# Patient Record
Sex: Male | Born: 1955 | Race: White | Hispanic: No | Marital: Single | Smoking: Current every day smoker
Health system: Southern US, Community
[De-identification: ages and names within clinical notes are randomized; demographics above are authoritative.]

## PROBLEM LIST (undated history)

## (undated) DIAGNOSIS — I10 Essential (primary) hypertension: Secondary | ICD-10-CM

## (undated) DIAGNOSIS — E785 Hyperlipidemia, unspecified: Secondary | ICD-10-CM

## (undated) DIAGNOSIS — J438 Other emphysema: Secondary | ICD-10-CM

## (undated) HISTORY — DX: Hyperlipidemia, unspecified: E78.5

## (undated) HISTORY — DX: Other emphysema: J43.8

## (undated) HISTORY — DX: Essential (primary) hypertension: I10

---

## 2013-04-29 ENCOUNTER — Ambulatory Visit: Payer: Self-pay | Admitting: Gastroenterology

## 2013-05-02 LAB — PATHOLOGY REPORT

## 2013-08-01 ENCOUNTER — Ambulatory Visit: Payer: Self-pay | Admitting: Family Medicine

## 2015-05-23 ENCOUNTER — Telehealth: Payer: Self-pay | Admitting: Family Medicine

## 2015-05-24 NOTE — Telephone Encounter (Signed)
-----   Message from Ellyn HackSyed Asad A Shah, MD sent at 05/23/2015  6:29 PM EDT ----- Regarding: Lab results. HDL is below normal. LDL, TC, and TG are within normal limits.

## 2015-05-24 NOTE — Telephone Encounter (Signed)
Patient notified. Amann CMA

## 2015-08-23 ENCOUNTER — Ambulatory Visit (INDEPENDENT_AMBULATORY_CARE_PROVIDER_SITE_OTHER): Payer: Managed Care, Other (non HMO) | Admitting: Family Medicine

## 2015-08-23 ENCOUNTER — Encounter: Payer: Self-pay | Admitting: Family Medicine

## 2015-08-23 VITALS — BP 121/70 | HR 85 | Temp 97.8°F | Resp 18 | Ht 68.0 in | Wt 198.5 lb

## 2015-08-23 DIAGNOSIS — E785 Hyperlipidemia, unspecified: Secondary | ICD-10-CM | POA: Diagnosis not present

## 2015-08-23 DIAGNOSIS — I10 Essential (primary) hypertension: Secondary | ICD-10-CM | POA: Insufficient documentation

## 2015-08-23 NOTE — Progress Notes (Signed)
Name: Travis Watson   MRN: [id number]    DOB: 08-01-1956   Date:08/23/2015       Progress Note  Subjective  Chief Complaint  Chief Complaint  Patient presents with  . Follow-up    3 mo.  . Hypertension  . Hyperlipidemia    Hypertension This is a chronic problem. The problem is controlled. Pertinent negatives include no chest pain, headaches, palpitations or shortness of breath. Past treatments include angiotensin blockers. There is no history of kidney disease, CAD/MI or CVA.  Hyperlipidemia This is a chronic problem. The problem is controlled. Recent lipid tests were reviewed and are normal. Pertinent negatives include no chest pain, leg pain, myalgias or shortness of breath. Current antihyperlipidemic treatment includes statins. Risk factors for coronary artery disease include dyslipidemia and male sex.   Past Medical History  Diagnosis Date  . Hyperlipidemia   . Hypertension     History reviewed. No pertinent past surgical history.  Family History  Problem Relation Age of Onset  . Diabetes Mother   . Emphysema Mother   . Hyperlipidemia Father   . Cancer Father     prostate  . Heart disease Paternal Grandfather     Social History   Social History  . Marital Status: Single    Spouse Name: N/A  . Number of Children: N/A  . Years of Education: N/A   Occupational History  . Not on file.   Social History Main Topics  . Smoking status: Current Every Day Smoker -- 0.50 packs/day    Types: Cigarettes  . Smokeless tobacco: Former Neurosurgeon    Types: Snuff     Comment: quit smokeless tabacco about 40 years ago  . Alcohol Use: 0.0 oz/week    0 Standard drinks or equivalent per week     Comment: occasional  . Drug Use: No  . Sexual Activity: Not on file   Other Topics Concern  . Not on file   Social History Narrative  . No narrative on file     Current outpatient prescriptions:  .  atorvastatin (LIPITOR) 40 MG tablet, Take 1 tablet by mouth at bedtime.,  Disp: , Rfl: 1 .  losartan (COZAAR) 50 MG tablet, Take 50 mg by mouth daily., Disp: , Rfl: 5  No Known Allergies   Review of Systems  Respiratory: Negative for shortness of breath.   Cardiovascular: Negative for chest pain and palpitations.  Musculoskeletal: Negative for myalgias.  Neurological: Negative for headaches.   Objective  Filed Vitals:   08/23/15 0817  BP: 121/70  Pulse: 85  Temp: 97.8 F (36.6 C)  TempSrc: Oral  Resp: 18  Height:  (1.727 m)  Weight: 198 lb 8 oz (90.039 kg)  SpO2: 96%    Physical Exam  Constitutional: He is oriented to person, place, and time and well-developed, well-nourished, and in no distress.  HENT:  Head: Normocephalic and atraumatic.  Cardiovascular: Normal rate and regular rhythm.   Pulmonary/Chest: Effort normal and breath sounds normal.  Neurological: He is alert and oriented to person, place, and time.  Psychiatric: Affect and judgment normal.  Nursing note and vitals reviewed.   Assessment & Plan  1. Essential hypertension Blood Pressure at goal on present therapy. Follow-up in 4 months.  2. Dyslipidemia  - Comprehensive Metabolic Panel (CMET) - Lipid Profile   [facility]ravis Watson [facility] 08/23/2015 8:38 AM

## 2015-08-24 LAB — COMPREHENSIVE METABOLIC PANEL
A/G RATIO: 2 (ref 1.1–2.5)
ALK PHOS: 60 IU/L (ref 39–117)
ALT: 26 IU/L (ref 0–44)
AST: 16 IU/L (ref 0–40)
Albumin: 4.6 g/dL (ref 3.5–5.5)
BUN/Creatinine Ratio: 13 (ref 9–20)
BUN: 12 mg/dL (ref 6–24)
Bilirubin Total: 0.5 mg/dL (ref 0.0–1.2)
CO2: 25 mmol/L (ref 18–29)
Calcium: 9.2 mg/dL (ref 8.7–10.2)
Chloride: 103 mmol/L (ref 97–108)
Creatinine, Ser: 0.95 mg/dL (ref 0.76–1.27)
GFR calc non Af Amer: 88 mL/min/{1.73_m2} (ref >59)
GFR, EST AFRICAN AMERICAN: 102 mL/min/{1.73_m2} (ref >59)
GLUCOSE: 109 mg/dL — AB (ref 65–99)
Globulin, Total: 2.3 g/dL (ref 1.5–4.5)
POTASSIUM: 5.1 mmol/L (ref 3.5–5.2)
Sodium: 141 mmol/L (ref 134–144)
TOTAL PROTEIN: 6.9 g/dL (ref 6.0–8.5)

## 2015-08-24 LAB — LIPID PANEL
CHOLESTEROL TOTAL: 118 mg/dL (ref 100–199)
Chol/HDL Ratio: 4.5 ratio units (ref 0.0–5.0)
HDL: 26 mg/dL — ABNORMAL LOW (ref >39)
LDL Calculated: 76 mg/dL (ref 0–99)
Triglycerides: 82 mg/dL (ref 0–149)
VLDL Cholesterol Cal: 16 mg/dL (ref 5–40)

## 2015-09-10 ENCOUNTER — Telehealth: Payer: Self-pay | Admitting: Family Medicine

## 2015-09-10 NOTE — Telephone Encounter (Signed)
Pt has been out of his atorvastatin for 10 days and he is requesting refill to be done. [facility].

## 2015-09-11 MED ORDER — ATORVASTATIN CALCIUM 40 MG PO TABS: 40.0000 mg | ORAL_TABLET | Freq: Every day | ORAL | Status: DC

## 2015-09-11 NOTE — Telephone Encounter (Signed)
Medication has been sent to pharmacy.  °

## 2015-09-11 NOTE — Telephone Encounter (Signed)
LMOM to inform pt of RX °

## 2015-11-16 ENCOUNTER — Other Ambulatory Visit: Payer: Self-pay | Admitting: Family Medicine

## 2015-12-25 ENCOUNTER — Encounter: Payer: Self-pay | Admitting: Family Medicine

## 2015-12-25 ENCOUNTER — Ambulatory Visit (INDEPENDENT_AMBULATORY_CARE_PROVIDER_SITE_OTHER): Payer: Managed Care, Other (non HMO) | Admitting: Family Medicine

## 2015-12-25 VITALS — BP 120/74 | HR 98 | Temp 99.2°F | Resp 16 | Ht 68.0 in | Wt 194.1 lb

## 2015-12-25 DIAGNOSIS — E785 Hyperlipidemia, unspecified: Secondary | ICD-10-CM

## 2015-12-25 DIAGNOSIS — I10 Essential (primary) hypertension: Secondary | ICD-10-CM

## 2015-12-25 DIAGNOSIS — R739 Hyperglycemia, unspecified: Secondary | ICD-10-CM | POA: Insufficient documentation

## 2015-12-25 DIAGNOSIS — Z716 Tobacco abuse counseling: Secondary | ICD-10-CM

## 2015-12-25 LAB — POCT GLYCOSYLATED HEMOGLOBIN (HGB A1C): HEMOGLOBIN A1C: 6

## 2015-12-25 MED ORDER — LOSARTAN POTASSIUM 50 MG PO TABS
50.0000 mg | ORAL_TABLET | Freq: Every day | ORAL | Status: DC
Start: 2015-12-25 — End: 2016-06-12

## 2015-12-25 MED ORDER — ATORVASTATIN CALCIUM 40 MG PO TABS: ORAL_TABLET | ORAL | Status: DC

## 2015-12-25 NOTE — Progress Notes (Signed)
Name: Travis SextonWilliam H Travis Watson Travis Watson   MRN: [id number]    DOB: Jan 23, 1956   Date:12/25/2015       Progress Note  Subjective  Chief Complaint  Chief Complaint  Patient presents with  . Follow-up    4 mo  . Hypertension  . Hyperlipidemia  . Medication Refill    Losartan 50 mg / atorvastatin 40mg     Hypertension This is a chronic problem. The problem is controlled. Pertinent negatives include no blurred vision, chest pain, headaches, orthopnea, palpitations or shortness of breath. Past treatments include angiotensin blockers. There is no history of kidney disease, CAD/MI or CVA.  Hyperlipidemia This is a chronic problem. The problem is controlled. Recent lipid tests were reviewed and are normal. Pertinent negatives include no chest pain, leg pain, myalgias or shortness of breath. Current antihyperlipidemic treatment includes statins. Risk factors for coronary artery disease include dyslipidemia and male sex.  Nicotine Dependence Presents for initial visit. Symptoms include cravings. Symptoms are negative for fatigue, headache and insomnia. Preferred tobacco types include cigarettes. Preferred cigarette types include filtered. Preferred brands include Marlboro. His urge triggers include company of smokers. His first smoke is from 6 to 8 AM. He smokes < 1/2 a pack of cigarettes per day. Past treatments include nothing. Compliance with prior treatments has been good. Travis Travis Watson has tried to quit 4 times.    Past Medical History  Diagnosis Date  . Hyperlipidemia   . Hypertension     History reviewed. No pertinent past surgical history.  Family History  Problem Relation Age of Onset  . Diabetes Mother   . Emphysema Mother   . Hyperlipidemia Father   . Cancer Father     prostate  . Heart disease Paternal Grandfather     Social History   Social History  . Marital Status: Single    Spouse Name: N/A  . Number of Children: N/A  . Years of Education: N/A   Occupational History  . Not on  file.   Social History Main Topics  . Smoking status: Current Every Day Smoker -- 0.50 packs/day    Types: Cigarettes  . Smokeless tobacco: Former NeurosurgeonUser    Types: Snuff     Comment: quit smokeless tabacco about 40 years ago  . Alcohol Use: 0.0 oz/week    0 Standard drinks or equivalent per week     Comment: occasional  . Drug Use: No  . Sexual Activity: Not on file   Other Topics Concern  . Not on file   Social History Narrative     Current outpatient prescriptions:  .  atorvastatin (LIPITOR) 40 MG tablet, TAKE 1 TABLET (40 MG TOTAL) BY MOUTH AT BEDTIME., Disp: 30 tablet, Rfl: 1 .  clobetasol cream (TEMOVATE) 0.05 %, APPLY TO AFFECTED AREA TWICE A DAY AS NEEDED -NOT TO FACE,GROIN OR UNDERARMS, Disp: , Rfl: 2 .  losartan (COZAAR) 50 MG tablet, Take 50 mg by mouth daily., Disp: , Rfl: 5  No Known Allergies   Review of Systems  Constitutional: Negative for fatigue.  Eyes: Negative for blurred vision.  Respiratory: Negative for shortness of breath.   Cardiovascular: Negative for chest pain, palpitations and orthopnea.  Musculoskeletal: Negative for myalgias.  Neurological: Negative for headaches.  Psychiatric/Behavioral: The patient does not have insomnia.     Objective  Filed Vitals:   12/25/15 0813  BP: 120/74  Pulse: 98  Temp: 99.2 F (37.3 C)  TempSrc: Oral  Resp: 16  Height: 5\' 8"  (1.727 m)  Weight: 194  lb 1.6 oz (88.043 kg)  SpO2: 97%    Physical Exam  Constitutional: He is oriented to person, place, and time and well-developed, well-nourished, and in no distress.  HENT:  Head: Normocephalic and atraumatic.  Eyes: Conjunctivae are normal. Pupils are equal, round, and reactive to light.  Cardiovascular: Normal rate and regular rhythm.   Pulmonary/Chest: Effort normal and breath sounds normal.  Abdominal: Soft. Bowel sounds are normal.  Musculoskeletal: Normal range of motion. He exhibits no edema.  Neurological: He is alert and oriented to person, place,  and time.  Psychiatric: Affect and judgment normal.  Nursing note and vitals reviewed.    Assessment & Plan  1. Essential hypertension BP at goal. - losartan (COZAAR) 50 MG tablet; Take 1 tablet (50 mg total) by mouth daily.  Dispense: 30 tablet; Refill: 5  2. Hyperlipidemia HDL below normal. Otherwise at goal. - Lipid Profile - atorvastatin (LIPITOR) 40 MG tablet; TAKE 1 TABLET (40 MG TOTAL) BY MOUTH AT BEDTIME.  Dispense: 30 tablet; Refill: 5  3. Hyperglycemia  - POCT HgB A1C  4. Tobacco abuse counseling Pt. Is not ready to quit at this time.Kathi Ludwig Asad A. [facility] 12/25/2015 8:23 AM

## 2015-12-26 LAB — LIPID PANEL
Chol/HDL Ratio: 5.1 ratio units — ABNORMAL HIGH (ref 0.0–5.0)
Cholesterol, Total: 127 mg/dL (ref 100–199)
HDL: 25 mg/dL — ABNORMAL LOW (ref >39)
LDL Calculated: 78 mg/dL (ref 0–99)
TRIGLYCERIDES: 118 mg/dL (ref 0–149)
VLDL Cholesterol Cal: 24 mg/dL (ref 5–40)

## 2016-06-12 ENCOUNTER — Other Ambulatory Visit: Payer: Self-pay | Admitting: Family Medicine

## 2016-06-16 ENCOUNTER — Ambulatory Visit: Payer: Managed Care, Other (non HMO) | Admitting: Family Medicine

## 2016-06-18 ENCOUNTER — Ambulatory Visit: Payer: Managed Care, Other (non HMO) | Admitting: Family Medicine

## 2016-06-25 ENCOUNTER — Encounter: Payer: Self-pay | Admitting: Family Medicine

## 2016-06-25 ENCOUNTER — Ambulatory Visit (INDEPENDENT_AMBULATORY_CARE_PROVIDER_SITE_OTHER): Payer: Managed Care, Other (non HMO) | Admitting: Family Medicine

## 2016-06-25 VITALS — BP 118/71 | HR 96 | Temp 98.6°F | Resp 15 | Ht 68.0 in | Wt 189.3 lb

## 2016-06-25 DIAGNOSIS — I10 Essential (primary) hypertension: Secondary | ICD-10-CM | POA: Diagnosis not present

## 2016-06-25 DIAGNOSIS — E786 Lipoprotein deficiency: Secondary | ICD-10-CM | POA: Diagnosis not present

## 2016-06-25 DIAGNOSIS — E785 Hyperlipidemia, unspecified: Secondary | ICD-10-CM

## 2016-06-25 MED ORDER — ATORVASTATIN CALCIUM 40 MG PO TABS: ORAL_TABLET | ORAL | Status: DC

## 2016-06-25 NOTE — Progress Notes (Signed)
Name: Travis SextonWilliam H Kelnhofer III   MRN: [id number]    DOB: Oct 24, 1956   Date:06/25/2016       Progress Note  Subjective  Chief Complaint  Chief Complaint  Patient presents with  . Follow-up    Cholesterol check  . Medication Refill    atorvastatin 40 mg     Hypertension This is a chronic problem. The problem is unchanged. The problem is controlled. Pertinent negatives include no blurred vision, chest pain, headaches or palpitations. Past treatments include angiotensin blockers.  Hyperlipidemia This is a chronic problem. The problem is uncontrolled (Below normal HDL). Recent lipid tests were reviewed and are low (Below normal HDL.). Pertinent negatives include no chest pain. Current antihyperlipidemic treatment includes statins.     Past Medical History  Diagnosis Date  . Hyperlipidemia   . Hypertension     History reviewed. No pertinent past surgical history.  Family History  Problem Relation Age of Onset  . Diabetes Mother   . Emphysema Mother   . Hyperlipidemia Father   . Cancer Father     prostate  . Heart disease Paternal Grandfather     Social History   Social History  . Marital Status: Single    Spouse Name: N/A  . Number of Children: N/A  . Years of Education: N/A   Occupational History  . Not on file.   Social History Main Topics  . Smoking status: Current Every Day Smoker -- 0.50 packs/day    Types: Cigarettes  . Smokeless tobacco: Former NeurosurgeonUser    Types: Snuff     Comment: quit smokeless tabacco about 40 years ago  . Alcohol Use: 0.0 oz/week    0 Standard drinks or equivalent per week     Comment: occasional  . Drug Use: No  . Sexual Activity: Not on file   Other Topics Concern  . Not on file   Social History Narrative     Current outpatient prescriptions:  .  atorvastatin (LIPITOR) 40 MG tablet, TAKE 1 TABLET (40 MG TOTAL) BY MOUTH AT BEDTIME., Disp: 30 tablet, Rfl: 5 .  clobetasol cream (TEMOVATE) 0.05 %, APPLY TO AFFECTED AREA TWICE A DAY  AS NEEDED -NOT TO FACE,GROIN OR UNDERARMS, Disp: , Rfl: 2 .  losartan (COZAAR) 50 MG tablet, TAKE 1 TABLET (50 MG TOTAL) BY MOUTH DAILY., Disp: 30 tablet, Rfl: 5  No Known Allergies   Review of Systems  Eyes: Negative for blurred vision.  Cardiovascular: Negative for chest pain and palpitations.  Neurological: Negative for headaches.     Objective  Filed Vitals:   06/25/16 0814  BP: 118/71  Pulse: 96  Temp: 98.6 F (37 C)  TempSrc: Oral  Resp: 15  Height: 5\' 8"  (1.727 m)  Weight: 189 lb 4.8 oz (85.866 kg)  SpO2: 97%    Physical Exam  Constitutional: He is oriented to person, place, and time and well-developed, well-nourished, and in no distress.  HENT:  Head: Normocephalic and atraumatic.  Cardiovascular: Normal rate, regular rhythm and normal heart sounds.   No murmur heard. Pulmonary/Chest: Effort normal and breath sounds normal. He has no wheezes.  Abdominal: Soft. Bowel sounds are normal.  Musculoskeletal: Normal range of motion. He exhibits no edema.  Neurological: He is alert and oriented to person, place, and time.  Nursing note and vitals reviewed.       Assessment & Plan  1. Essential hypertension BP at goal, continue on present antihypertensive therapy  2. Hyperlipidemia with low HDL Encouraged physical activity  to raise HDL. Continue on atorvastatin, refills provided - atorvastatin (LIPITOR) 40 MG tablet; TAKE 1 TABLET (40 MG TOTAL) BY MOUTH AT BEDTIME.  Dispense: 30 tablet; Refill: 5 - Lipid Profile - Comprehensive Metabolic Panel (CMET)   Harrington Jobe Asad A. Faylene KurtzShah [facility] 06/25/2016 8:33 AM

## 2016-06-26 LAB — COMPREHENSIVE METABOLIC PANEL
ALT: 30 IU/L (ref 0–44)
AST: 23 IU/L (ref 0–40)
Albumin/Globulin Ratio: 1.5 (ref 1.2–2.2)
Albumin: 4.3 g/dL (ref 3.5–5.5)
Alkaline Phosphatase: 62 IU/L (ref 39–117)
BUN/Creatinine Ratio: 16 (ref 9–20)
BUN: 14 mg/dL (ref 6–24)
Bilirubin Total: 0.4 mg/dL (ref 0.0–1.2)
CALCIUM: 9.4 mg/dL (ref 8.7–10.2)
CO2: 19 mmol/L (ref 18–29)
CREATININE: 0.9 mg/dL (ref 0.76–1.27)
Chloride: 102 mmol/L (ref 96–106)
GFR calc Af Amer: 108 mL/min/{1.73_m2} (ref >59)
GFR, EST NON AFRICAN AMERICAN: 93 mL/min/{1.73_m2} (ref >59)
GLOBULIN, TOTAL: 2.9 g/dL (ref 1.5–4.5)
Glucose: 103 mg/dL — ABNORMAL HIGH (ref 65–99)
Potassium: 4.5 mmol/L (ref 3.5–5.2)
Sodium: 140 mmol/L (ref 134–144)
Total Protein: 7.2 g/dL (ref 6.0–8.5)

## 2016-06-26 LAB — LIPID PANEL
CHOL/HDL RATIO: 4.9 ratio (ref 0.0–5.0)
Cholesterol, Total: 153 mg/dL (ref 100–199)
HDL: 31 mg/dL — ABNORMAL LOW (ref >39)
LDL CALC: 99 mg/dL (ref 0–99)
TRIGLYCERIDES: 115 mg/dL (ref 0–149)
VLDL Cholesterol Cal: 23 mg/dL (ref 5–40)

## 2016-11-18 ENCOUNTER — Encounter: Payer: Self-pay | Admitting: Family Medicine

## 2016-11-18 ENCOUNTER — Ambulatory Visit (INDEPENDENT_AMBULATORY_CARE_PROVIDER_SITE_OTHER): Payer: Managed Care, Other (non HMO) | Admitting: Family Medicine

## 2016-11-18 ENCOUNTER — Ambulatory Visit
Admission: RE | Admit: 2016-11-18 | Discharge: 2016-11-18 | Disposition: A | Discharge status: Home / Self Care | Payer: Managed Care, Other (non HMO) | Source: Ambulatory Visit | Attending: Family Medicine | Admitting: Family Medicine

## 2016-11-18 DIAGNOSIS — M5030 Other cervical disc degeneration, unspecified cervical region: Secondary | ICD-10-CM | POA: Insufficient documentation

## 2016-11-18 DIAGNOSIS — M542 Cervicalgia: Secondary | ICD-10-CM

## 2016-11-18 MED ORDER — NAPROXEN 500 MG PO TABS: 500.0000 mg | ORAL_TABLET | Freq: Two times a day (BID) | ORAL | 0 refills | Status: DC

## 2016-11-18 MED ORDER — TIZANIDINE HCL 2 MG PO TABS: 2.0000 mg | ORAL_TABLET | Freq: Three times a day (TID) | ORAL | 0 refills | Status: DC | PRN

## 2016-11-18 NOTE — Progress Notes (Signed)
Name: Travis SextonWilliam H Larzelere Watson   MRN: [id number]    DOB: [birth year]   Date:11/18/2016       Progress Note  Subjective  Chief Complaint  Chief Complaint  Patient presents with  . Arm Pain    left arm pain    Neck Pain   This is a new problem. The pain is associated with nothing. The pain is present in the left side. The quality of the pain is described as aching. The pain is at a severity of 2/10. The symptoms are aggravated by position (stretching his left arm forward makes it worse). The pain is worse during the night. Associated symptoms include tingling (in left arm). Pertinent negatives include no chest pain or fever. He has tried NSAIDs for the symptoms. The treatment provided moderate relief.    Past Medical History:  Diagnosis Date  . Hyperlipidemia   . Hypertension     No past surgical history on file.  Family History  Problem Relation Age of Onset  . Diabetes Mother   . Emphysema Mother   . Hyperlipidemia Father   . Cancer Father     prostate  . Heart disease Paternal Grandfather     Social History   Social History  . Marital status: Single    Spouse name: N/A  . Number of children: N/A  . Years of education: N/A   Occupational History  . Not on file.   Social History Main Topics  . Smoking status: Current Every Day Smoker    Packs/day: 0.50    Types: Cigarettes  . Smokeless tobacco: Former NeurosurgeonUser    Types: Snuff     Comment: quit smokeless tabacco about 40 years ago  . Alcohol use 0.0 oz/week     Comment: occasional  . Drug use: No  . Sexual activity: Not on file   Other Topics Concern  . Not on file   Social History Narrative  . No narrative on file     Current Outpatient Prescriptions:  .  atorvastatin (LIPITOR) 40 MG tablet, TAKE 1 TABLET (40 MG TOTAL) BY MOUTH AT BEDTIME., Disp: 30 tablet, Rfl: 5 .  clobetasol cream (TEMOVATE) 0.05 %, APPLY TO AFFECTED AREA TWICE A DAY AS NEEDED -NOT TO FACE,GROIN OR UNDERARMS, Disp: , Rfl: 2 .  losartan  (COZAAR) 50 MG tablet, TAKE 1 TABLET (50 MG TOTAL) BY MOUTH DAILY., Disp: 30 tablet, Rfl: 5  No Known Allergies   Review of Systems  Constitutional: Negative for chills, fever and malaise/fatigue.  Cardiovascular: Negative for chest pain.  Musculoskeletal: Positive for myalgias and neck pain.  Neurological: Positive for tingling (in left arm).     Objective  Vitals:   11/18/16 0849  BP: 132/80  Pulse: 89  Resp: 16  Temp: 98.8 F (37.1 C)  TempSrc: Oral  SpO2: 97%  Weight: 194 lb 1.6 oz (88 kg)  Height: 5\' 8"  (1.727 m)    Physical Exam  Constitutional: He is oriented to person, place, and time and well-developed, well-nourished, and in no distress.  Musculoskeletal:       Cervical back: He exhibits tenderness.       Back:       Left upper arm: He exhibits no tenderness, no bony tenderness, no swelling and no edema.  Neurological: He is alert and oriented to person, place, and time.  Psychiatric: Mood, memory, affect and judgment normal.  Nursing note and vitals reviewed.    Assessment & Plan  1. Acute neck pain Likely  muscle spasm, obtain x-ray of spine based on radicular symptoms - tiZANidine (ZANAFLEX) 2 MG tablet; Take 1 tablet (2 mg total) by mouth every 8 (eight) hours as needed for muscle spasms.  Dispense: 30 tablet; Refill: 0 - naproxen (NAPROSYN) 500 MG tablet; Take 1 tablet (500 mg total) by mouth 2 (two) times daily with a meal.  Dispense: 15 tablet; Refill: 0 - DG Cervical Spine Complete; Future   Taunya Goral Asad A. Faylene KurtzShah [facility] 11/18/2016 8:57 AM

## 2016-11-25 ENCOUNTER — Ambulatory Visit (INDEPENDENT_AMBULATORY_CARE_PROVIDER_SITE_OTHER): Payer: Managed Care, Other (non HMO) | Admitting: Family Medicine

## 2016-11-25 ENCOUNTER — Other Ambulatory Visit: Payer: Self-pay | Admitting: Family Medicine

## 2016-11-25 ENCOUNTER — Encounter: Payer: Self-pay | Admitting: Family Medicine

## 2016-11-25 DIAGNOSIS — M5030 Other cervical disc degeneration, unspecified cervical region: Secondary | ICD-10-CM | POA: Diagnosis not present

## 2016-11-25 DIAGNOSIS — M4802 Spinal stenosis, cervical region: Secondary | ICD-10-CM | POA: Insufficient documentation

## 2016-11-25 DIAGNOSIS — M9981 Other biomechanical lesions of cervical region: Secondary | ICD-10-CM

## 2016-11-25 MED ORDER — TIZANIDINE HCL 2 MG PO TABS: 2.0000 mg | ORAL_TABLET | Freq: Three times a day (TID) | ORAL | 0 refills | Status: DC | PRN

## 2016-11-25 MED ORDER — MELOXICAM 15 MG PO TABS: 15.0000 mg | ORAL_TABLET | Freq: Every day | ORAL | 2 refills | Status: DC

## 2016-11-25 NOTE — Progress Notes (Signed)
Name: Travis SextonWilliam H Jeziorski III   MRN: [id number]    DOB: 1956-11-26   Date:11/25/2016       Progress Note  Subjective  Chief Complaint  Chief Complaint  Patient presents with  . Follow-up    discuss xray results    HPI  Pt. Presents for follow up on cervical spine X rays, he was having lower neck pain with tingling and numbness in the left arm, X rays showed degenerative disc disease at multiple levels and neural foraminal narrowing at C6-7 and C7-T1 levels. He has been taking Naproxen prescribed at last visit which helps relieve his pain.   Past Medical History:  Diagnosis Date  . Hyperlipidemia   . Hypertension     No past surgical history on file.  Family History  Problem Relation Age of Onset  . Diabetes Mother   . Emphysema Mother   . Hyperlipidemia Father   . Cancer Father     prostate  . Heart disease Paternal Grandfather     Social History   Social History  . Marital status: Single    Spouse name: N/A  . Number of children: N/A  . Years of education: N/A   Occupational History  . Not on file.   Social History Main Topics  . Smoking status: Current Every Day Smoker    Packs/day: 0.50    Types: Cigarettes  . Smokeless tobacco: Former NeurosurgeonUser    Types: Snuff     Comment: quit smokeless tabacco about 40 years ago  . Alcohol use 0.0 oz/week     Comment: occasional  . Drug use: No  . Sexual activity: Not on file   Other Topics Concern  . Not on file   Social History Narrative  . No narrative on file     Current Outpatient Prescriptions:  .  atorvastatin (LIPITOR) 40 MG tablet, TAKE 1 TABLET (40 MG TOTAL) BY MOUTH AT BEDTIME., Disp: 30 tablet, Rfl: 5 .  clobetasol cream (TEMOVATE) 0.05 %, APPLY TO AFFECTED AREA TWICE A DAY AS NEEDED -NOT TO FACE,GROIN OR UNDERARMS, Disp: , Rfl: 2 .  losartan (COZAAR) 50 MG tablet, TAKE 1 TABLET (50 MG TOTAL) BY MOUTH DAILY., Disp: 30 tablet, Rfl: 5 .  naproxen (NAPROSYN) 500 MG tablet, Take 1 tablet (500 mg total) by  mouth 2 (two) times daily with a meal., Disp: 15 tablet, Rfl: 0 .  tiZANidine (ZANAFLEX) 2 MG tablet, Take 1 tablet (2 mg total) by mouth every 8 (eight) hours as needed for muscle spasms., Disp: 30 tablet, Rfl: 0  No Known Allergies   Review of Systems  Constitutional: Negative for chills, fever and malaise/fatigue.  Musculoskeletal: Positive for joint pain.  Neurological: Positive for tingling.      Objective  Vitals:   11/25/16 1403  BP: 130/72  Pulse: 86  Resp: 16  Temp: 98.2 F (36.8 C)  TempSrc: Oral  SpO2: 98%  Weight: 199 lb 9.6 oz (90.5 kg)  Height: 5\' 8"  (1.727 m)    Physical Exam  Constitutional: He is well-developed, well-nourished, and in no distress.  Cardiovascular: Normal rate, regular rhythm and normal heart sounds.   No murmur heard. Pulmonary/Chest: Effort normal and breath sounds normal. He has no wheezes.  Musculoskeletal:       Cervical back: He exhibits tenderness.       Back:  Nursing note and vitals reviewed.     Assessment & Plan  1. Degenerative disc disease, cervical X-ray findings reviewed, will start on meloxicam  and tizanidine for relief of muscle spasm - meloxicam (MOBIC) 15 MG tablet; Take 1 tablet (15 mg total) by mouth daily.  Dispense: 30 tablet; Refill: 2 - tiZANidine (ZANAFLEX) 2 MG tablet; Take 1 tablet (2 mg total) by mouth every 8 (eight) hours as needed for muscle spasms.  Dispense: 30 tablet; Refill: 0  2. Neural foraminal stenosis of cervical spine Likely responsible for radicular symptoms in left hand. We will refer to neurosurgery - Ambulatory referral to Neurosurgery     [facility] Asad A. Faylene KurtzShah [facility] 11/25/2016 2:15 PM

## 2016-12-10 ENCOUNTER — Other Ambulatory Visit: Payer: Self-pay | Admitting: Family Medicine

## 2016-12-10 DIAGNOSIS — M5030 Other cervical disc degeneration, unspecified cervical region: Secondary | ICD-10-CM

## 2016-12-21 ENCOUNTER — Other Ambulatory Visit: Payer: Self-pay | Admitting: Family Medicine

## 2016-12-21 DIAGNOSIS — E786 Lipoprotein deficiency: Principal | ICD-10-CM

## 2016-12-21 DIAGNOSIS — E785 Hyperlipidemia, unspecified: Secondary | ICD-10-CM

## 2016-12-24 NOTE — Telephone Encounter (Signed)
Medication has been refilled and sent to [facility] 

## 2016-12-26 ENCOUNTER — Ambulatory Visit: Payer: Managed Care, Other (non HMO) | Admitting: Family Medicine

## 2017-01-22 ENCOUNTER — Encounter: Payer: Self-pay | Admitting: Family Medicine

## 2017-01-22 ENCOUNTER — Ambulatory Visit (INDEPENDENT_AMBULATORY_CARE_PROVIDER_SITE_OTHER): Payer: Managed Care, Other (non HMO) | Admitting: Family Medicine

## 2017-01-22 VITALS — BP 127/68 | HR 97 | Temp 98.4°F | Resp 17 | Ht 68.0 in | Wt 192.2 lb

## 2017-01-22 DIAGNOSIS — E786 Lipoprotein deficiency: Secondary | ICD-10-CM

## 2017-01-22 DIAGNOSIS — E785 Hyperlipidemia, unspecified: Secondary | ICD-10-CM

## 2017-01-22 DIAGNOSIS — I10 Essential (primary) hypertension: Secondary | ICD-10-CM

## 2017-01-22 LAB — COMPLETE METABOLIC PANEL WITH GFR
ALBUMIN: 4.2 g/dL (ref 3.6–5.1)
ALK PHOS: 53 U/L (ref 40–115)
ALT: 28 U/L (ref 9–46)
AST: 20 U/L (ref 10–35)
BILIRUBIN TOTAL: 0.6 mg/dL (ref 0.2–1.2)
BUN: 13 mg/dL (ref 7–25)
CALCIUM: 9.4 mg/dL (ref 8.6–10.3)
CO2: 28 mmol/L (ref 20–31)
Chloride: 105 mmol/L (ref 98–110)
Creat: 0.98 mg/dL (ref 0.70–1.33)
GFR, Est African American: >89 mL/min (ref >=60)
GFR, Est Non African American: 84 mL/min (ref >=60)
GLUCOSE: 94 mg/dL (ref 65–99)
POTASSIUM: 4.8 mmol/L (ref 3.5–5.3)
SODIUM: 139 mmol/L (ref 135–146)
TOTAL PROTEIN: 7.2 g/dL (ref 6.1–8.1)

## 2017-01-22 LAB — LIPID PANEL
CHOL/HDL RATIO: 4.7 ratio (ref <5.0)
CHOLESTEROL: 127 mg/dL (ref <200)
HDL: 27 mg/dL — ABNORMAL LOW (ref >40)
LDL Cholesterol: 83 mg/dL (ref <100)
Triglycerides: 83 mg/dL (ref <150)
VLDL: 17 mg/dL (ref <30)

## 2017-01-22 MED ORDER — ATORVASTATIN CALCIUM 40 MG PO TABS: ORAL_TABLET | ORAL | 0 refills | Status: DC

## 2017-01-22 NOTE — Progress Notes (Signed)
Name: Travis Watson   MRN: [id number]    DOB: [birth year]   Date:01/22/2017       Progress Note  Subjective  Chief Complaint  Chief Complaint  Patient presents with  . Follow-up    cholesterol check/BP  . Medication Refill    Hypertension  This is a chronic problem. The problem is unchanged. The problem is controlled. Pertinent negatives include no blurred vision, chest pain, headaches or palpitations. Past treatments include angiotensin blockers.  Hyperlipidemia  This is a chronic problem. The problem is uncontrolled (Below normal HDL). Recent lipid tests were reviewed and are low (Below normal HDL.). Pertinent negatives include no chest pain. Current antihyperlipidemic treatment includes statins.    Past Medical History:  Diagnosis Date  . Hyperlipidemia   . Hypertension     History reviewed. No pertinent surgical history.  Family History  Problem Relation Age of Onset  . Diabetes Mother   . Emphysema Mother   . Hyperlipidemia Father   . Cancer Father     prostate  . Heart disease Paternal Grandfather     Social History   Social History  . Marital status: Single    Spouse name: N/A  . Number of children: N/A  . Years of education: N/A   Occupational History  . Not on file.   Social History Main Topics  . Smoking status: Current Every Day Smoker    Packs/day: 0.50    Types: Cigarettes  . Smokeless tobacco: Former Neurosurgeon    Types: Snuff     Comment: quit smokeless tabacco about 40 years ago  . Alcohol use 0.0 oz/week     Comment: occasional  . Drug use: No  . Sexual activity: Not on file   Other Topics Concern  . Not on file   Social History Narrative  . No narrative on file     Current Outpatient Prescriptions:  .  atorvastatin (LIPITOR) 40 MG tablet, TAKE 1 TABLET (40 MG TOTAL) BY MOUTH AT BEDTIME., Disp: 90 tablet, Rfl: 0 .  clobetasol cream (TEMOVATE) 0.05 %, APPLY TO AFFECTED AREA TWICE A DAY AS NEEDED -NOT TO FACE,GROIN OR UNDERARMS,  Disp: , Rfl: 2 .  losartan (COZAAR) 50 MG tablet, TAKE 1 TABLET (50 MG TOTAL) BY MOUTH DAILY., Disp: 30 tablet, Rfl: 5  No Known Allergies   Review of Systems  Eyes: Negative for blurred vision.  Cardiovascular: Negative for chest pain and palpitations.  Neurological: Negative for headaches.    Objective  Vitals:   01/22/17 0826  BP: 127/68  Pulse: 97  Resp: 17  Temp: 98.4 F (36.9 C)  TempSrc: Oral  SpO2: 96%  Weight: 192 lb 3.2 oz (87.2 kg)  Height: 5\' 8"  (1.727 m)    Physical Exam  Constitutional: He is oriented to person, place, and time and well-developed, well-nourished, and in no distress.  HENT:  Head: Normocephalic and atraumatic.  Cardiovascular: Normal rate, regular rhythm and normal heart sounds.   No murmur heard. Pulmonary/Chest: Effort normal and breath sounds normal. He has no wheezes.  Abdominal: Soft. Bowel sounds are normal. There is no tenderness.  Musculoskeletal: Normal range of motion. He exhibits no edema.  Neurological: He is alert and oriented to person, place, and time.  Psychiatric: Mood, memory, affect and judgment normal.  Nursing note and vitals reviewed.    Assessment & Plan  1. Hyperlipidemia with low HDL Repeat FLP, continue on statin therapy. - Lipid Profile - COMPLETE METABOLIC PANEL WITH GFR - atorvastatin (  LIPITOR) 40 MG tablet; TAKE 1 TABLET (40 MG TOTAL) BY MOUTH AT BEDTIME.  Dispense: 90 tablet; Refill: 0  2. Essential hypertension BP stable and controlled on anti-hypertensive therapy.   Travis Watson Asad A. Faylene KurtzShah [facility] 01/22/2017 8:32 AM

## 2017-02-14 ENCOUNTER — Other Ambulatory Visit: Payer: Self-pay | Admitting: Family Medicine

## 2017-02-14 DIAGNOSIS — M5030 Other cervical disc degeneration, unspecified cervical region: Secondary | ICD-10-CM

## 2017-05-11 ENCOUNTER — Other Ambulatory Visit: Payer: Self-pay | Admitting: Family Medicine

## 2017-05-21 ENCOUNTER — Other Ambulatory Visit: Payer: Self-pay | Admitting: Family Medicine

## 2017-05-21 DIAGNOSIS — E785 Hyperlipidemia, unspecified: Secondary | ICD-10-CM

## 2017-05-21 DIAGNOSIS — E786 Lipoprotein deficiency: Principal | ICD-10-CM

## 2017-07-22 ENCOUNTER — Encounter: Payer: Self-pay | Admitting: Family Medicine

## 2017-07-22 ENCOUNTER — Ambulatory Visit (INDEPENDENT_AMBULATORY_CARE_PROVIDER_SITE_OTHER): Payer: Managed Care, Other (non HMO) | Admitting: Family Medicine

## 2017-07-22 VITALS — BP 125/74 | HR 86 | Temp 98.7°F | Resp 16 | Ht 68.0 in | Wt 191.9 lb

## 2017-07-22 DIAGNOSIS — I10 Essential (primary) hypertension: Secondary | ICD-10-CM

## 2017-07-22 DIAGNOSIS — E786 Lipoprotein deficiency: Secondary | ICD-10-CM

## 2017-07-22 DIAGNOSIS — E785 Hyperlipidemia, unspecified: Secondary | ICD-10-CM

## 2017-07-22 LAB — LIPID PANEL
CHOLESTEROL: 124 mg/dL (ref <200)
HDL: 25 mg/dL — ABNORMAL LOW (ref >40)
LDL Cholesterol: 82 mg/dL (ref <100)
Total CHOL/HDL Ratio: 5 Ratio — ABNORMAL HIGH (ref <5.0)
Triglycerides: 87 mg/dL (ref <150)
VLDL: 17 mg/dL (ref <30)

## 2017-07-22 MED ORDER — ATORVASTATIN CALCIUM 40 MG PO TABS: ORAL_TABLET | ORAL | 0 refills | Status: DC

## 2017-07-22 MED ORDER — LOSARTAN POTASSIUM 50 MG PO TABS: ORAL_TABLET | ORAL | 0 refills | Status: DC

## 2017-07-22 NOTE — Progress Notes (Signed)
Name: Travis SextonWilliam H Farnell Watson   MRN: [id number]    DOB: Jan 05, 1956   Date:07/22/2017       Progress Note  Subjective  Chief Complaint  Chief Complaint  Patient presents with  . Follow-up    6 mo  . Medication Refill    atorvastatin    Hypertension  This is a chronic problem. The problem is unchanged. The problem is controlled. Pertinent negatives include no blurred vision or palpitations. Past treatments include angiotensin blockers.  Hyperlipidemia  This is a chronic problem. The problem is uncontrolled (Below normal HDL). Recent lipid tests were reviewed and are low (Below normal HDL.). Current antihyperlipidemic treatment includes statins.     Past Medical History:  Diagnosis Date  . Hyperlipidemia   . Hypertension     History reviewed. No pertinent surgical history.  Family History  Problem Relation Age of Onset  . Diabetes Mother   . Emphysema Mother   . Hyperlipidemia Father   . Cancer Father        prostate  . Heart disease Paternal Grandfather     Social History   Social History  . Marital status: Single    Spouse name: N/A  . Number of children: N/A  . Years of education: N/A   Occupational History  . Not on file.   Social History Main Topics  . Smoking status: Current Every Day Smoker    Packs/day: 0.50    Types: Cigarettes  . Smokeless tobacco: Former NeurosurgeonUser    Types: Snuff     Comment: quit smokeless tabacco about 40 years ago  . Alcohol use 0.0 oz/week     Comment: occasional  . Drug use: No  . Sexual activity: Not on file   Other Topics Concern  . Not on file   Social History Narrative  . No narrative on file     Current Outpatient Prescriptions:  .  atorvastatin (LIPITOR) 40 MG tablet, TAKE 1 TABLET (40 MG TOTAL) BY MOUTH AT BEDTIME., Disp: 90 tablet, Rfl: 0 .  clobetasol cream (TEMOVATE) 0.05 %, APPLY TO AFFECTED AREA TWICE A DAY AS NEEDED -NOT TO FACE,GROIN OR UNDERARMS, Disp: , Rfl: 2 .  losartan (COZAAR) 50 MG tablet, TAKE 1  TABLET (50 MG TOTAL) BY MOUTH DAILY., Disp: 30 tablet, Rfl: 5  No Known Allergies   Review of Systems  Eyes: Negative for blurred vision.  Cardiovascular: Negative for palpitations.     Objective  Vitals:   07/22/17 0818  BP: 125/74  Pulse: 86  Resp: 16  Temp: 98.7 F (37.1 C)  TempSrc: Oral  SpO2: 98%  Weight: 191 lb 14.4 oz (87 kg)  Height: 5\' 8"  (1.727 m)    Physical Exam  Constitutional: He is oriented to person, place, and time and well-developed, well-nourished, and in no distress.  HENT:  Head: Normocephalic and atraumatic.  Cardiovascular: Normal rate, regular rhythm and normal heart sounds.   No murmur heard. Pulmonary/Chest: Effort normal and breath sounds normal. He has no wheezes.  Abdominal: Soft. Bowel sounds are normal. There is no tenderness.  Musculoskeletal: Normal range of motion. He exhibits no edema.  Neurological: He is alert and oriented to person, place, and time.  Psychiatric: Mood, memory, affect and judgment normal.  Nursing note and vitals reviewed.     Assessment & Plan  1. Hyperlipidemia with low HDL Persistent below normal HDL, continue on statin. - atorvastatin (LIPITOR) 40 MG tablet; TAKE 1 TABLET (40 MG TOTAL) BY MOUTH AT BEDTIME.  Dispense: 90 tablet; Refill: 0 - Lipid panel  2. Essential hypertension Stable on present antihypertensive treatment - losartan (COZAAR) 50 MG tablet; TAKE 1 TABLET (50 MG TOTAL) BY MOUTH DAILY.  Dispense: 90 tablet; Refill: 0   Keonia Pasko Asad A. Faylene KurtzShah [facility] 07/22/2017 8:35 AM

## 2017-08-19 ENCOUNTER — Other Ambulatory Visit: Payer: Self-pay | Admitting: Family Medicine

## 2017-08-19 DIAGNOSIS — E786 Lipoprotein deficiency: Principal | ICD-10-CM

## 2017-08-19 DIAGNOSIS — E785 Hyperlipidemia, unspecified: Secondary | ICD-10-CM

## 2017-11-23 ENCOUNTER — Telehealth: Payer: Self-pay | Admitting: Family Medicine

## 2017-11-23 ENCOUNTER — Other Ambulatory Visit: Payer: Self-pay | Admitting: Family Medicine

## 2017-11-23 DIAGNOSIS — E785 Hyperlipidemia, unspecified: Secondary | ICD-10-CM

## 2017-11-23 DIAGNOSIS — E786 Lipoprotein deficiency: Principal | ICD-10-CM

## 2017-11-23 DIAGNOSIS — I10 Essential (primary) hypertension: Secondary | ICD-10-CM

## 2017-11-23 MED ORDER — LOSARTAN POTASSIUM 50 MG PO TABS: ORAL_TABLET | ORAL | 0 refills | Status: DC

## 2017-11-23 NOTE — Telephone Encounter (Signed)
Copied from CRM [phone]. Topic: Quick Communication - See Telephone Encounter >> Nov 23, 2017 11:18 AM Guinevere FerrariMorris, Solmon Bohr E, NT wrote: CRM for notification. See Telephone encounter for: Pt is calling in for medication refills on atorvastatin (LIPITOR) 40 MG tablet and losartan (COZAAR) 50 MG table. [facility] [place] [facility] [address].  11/23/17.

## 2017-11-23 NOTE — Telephone Encounter (Signed)
Sent refill request to Dr. Sherryll BurgerShah for review

## 2017-12-29 ENCOUNTER — Ambulatory Visit (INDEPENDENT_AMBULATORY_CARE_PROVIDER_SITE_OTHER): Payer: Managed Care, Other (non HMO) | Admitting: Family Medicine

## 2017-12-29 ENCOUNTER — Encounter: Payer: Self-pay | Admitting: Family Medicine

## 2017-12-29 DIAGNOSIS — E786 Lipoprotein deficiency: Secondary | ICD-10-CM

## 2017-12-29 DIAGNOSIS — I10 Essential (primary) hypertension: Secondary | ICD-10-CM

## 2017-12-29 DIAGNOSIS — E785 Hyperlipidemia, unspecified: Secondary | ICD-10-CM | POA: Diagnosis not present

## 2017-12-29 MED ORDER — LOSARTAN POTASSIUM 50 MG PO TABS: ORAL_TABLET | ORAL | 0 refills | Status: DC

## 2017-12-29 MED ORDER — ATORVASTATIN CALCIUM 40 MG PO TABS: ORAL_TABLET | ORAL | 0 refills | Status: DC

## 2017-12-29 NOTE — Progress Notes (Signed)
Name: Travis Watson   MRN: [id number]    DOB: July 23, 1956   Date:12/29/2017       Progress Note  Subjective  Chief Complaint  Chief Complaint  Patient presents with  . Hypertension    6 mnth f/u; Pt denies any issue   . Hyperlipidemia    6 mnth f/u; Pt trys to get healthy, he stay away from the fried  . Medication Refill    Hypertension  This is a chronic problem. The problem is unchanged. The problem is controlled. Pertinent negatives include no blurred vision, chest pain, headaches or palpitations. Past treatments include angiotensin blockers. There is no history of kidney disease, CAD/MI or CVA.  Hyperlipidemia  This is a chronic problem. The problem is uncontrolled (Below normal HDL). Recent lipid tests were reviewed and are low (Below normal HDL.). Pertinent negatives include no chest pain. Current antihyperlipidemic treatment includes statins.     Past Medical History:  Diagnosis Date  . Hyperlipidemia   . Hypertension     History reviewed. No pertinent surgical history.  Family History  Problem Relation Age of Onset  . Diabetes Mother   . Emphysema Mother   . Hyperlipidemia Father   . Cancer Father        prostate  . Heart disease Maternal Grandmother   . Hypertension Maternal Grandmother   . Heart disease Maternal Grandfather   . Hypertension Maternal Grandfather   . Migraines Paternal Grandmother     Social History   Socioeconomic History  . Marital status: Single    Spouse name: Not on file  . Number of children: Not on file  . Years of education: Not on file  . Highest education level: Not on file  Social Needs  . Financial resource strain: Not on file  . Food insecurity - worry: Not on file  . Food insecurity - inability: Not on file  . Transportation needs - medical: Not on file  . Transportation needs - non-medical: Not on file  Occupational History  . Not on file  Tobacco Use  . Smoking status: Current Every Day Smoker    Packs/day:  0.50    Types: Cigarettes  . Smokeless tobacco: Former Neurosurgeon    Types: Snuff  . Tobacco comment: quit smokeless tabacco about 40 years ago  Substance and Sexual Activity  . Alcohol use: Yes    Alcohol/week: 0.0 oz    Comment: occasional  . Drug use: No  . Sexual activity: Not Currently  Other Topics Concern  . Not on file  Social History Narrative  . Not on file     Current Outpatient Medications:  .  atorvastatin (LIPITOR) 40 MG tablet, TAKE 1 TABLET BY MOUTH EVERYDAY AT BEDTIME, Disp: 90 tablet, Rfl: 0 .  clobetasol cream (TEMOVATE) 0.05 %, APPLY TO AFFECTED AREA TWICE A DAY AS NEEDED -NOT TO FACE,GROIN OR UNDERARMS, Disp: , Rfl: 2 .  losartan (COZAAR) 50 MG tablet, TAKE 1 TABLET (50 MG TOTAL) BY MOUTH DAILY., Disp: 90 tablet, Rfl: 0  No Known Allergies   Review of Systems  Eyes: Negative for blurred vision.  Cardiovascular: Negative for chest pain and palpitations.  Neurological: Negative for headaches.     Objective  Vitals:   12/29/17 0826  BP: 136/62  Pulse: 94  Resp: 16  Temp: 98.1 F (36.7 C)  TempSrc: Oral  SpO2: 96%  Weight: 193 lb (87.5 kg)  Height: 5\' 8"  (1.727 m)    Physical Exam  Constitutional: He  is oriented to person, place, and time and well-developed, well-nourished, and in no distress.  HENT:  Head: Normocephalic and atraumatic.  Cardiovascular: Normal rate, regular rhythm and normal heart sounds.  No murmur heard. Pulmonary/Chest: Effort normal and breath sounds normal. He has no wheezes.  Abdominal: Soft. Bowel sounds are normal. There is no tenderness.  Musculoskeletal: Normal range of motion. He exhibits no edema.  Neurological: He is alert and oriented to person, place, and time.  Psychiatric: Mood, memory, affect and judgment normal.  Nursing note and vitals reviewed.     Assessment & Plan  1. Essential hypertension BP stable on present anti-hypertensive treatment - losartan (COZAAR) 50 MG tablet; TAKE 1 TABLET (50 MG TOTAL)  BY MOUTH DAILY.  Dispense: 90 tablet; Refill: 0  2. Hyperlipidemia with low HDL FLP at goal except for low HDL, recheck in 3-6 months - atorvastatin (LIPITOR) 40 MG tablet; TAKE 1 TABLET BY MOUTH EVERYDAY AT BEDTIME  Dispense: 90 tablet; Refill: 0   Taunja Brickner Asad A. Faylene KurtzShah [facility] 12/29/2017 8:37 AM

## 2018-04-12 ENCOUNTER — Other Ambulatory Visit: Payer: Self-pay

## 2018-04-12 DIAGNOSIS — E785 Hyperlipidemia, unspecified: Secondary | ICD-10-CM

## 2018-04-12 DIAGNOSIS — E786 Lipoprotein deficiency: Principal | ICD-10-CM

## 2018-04-13 ENCOUNTER — Other Ambulatory Visit: Payer: Self-pay

## 2018-04-13 DIAGNOSIS — E785 Hyperlipidemia, unspecified: Secondary | ICD-10-CM

## 2018-04-13 DIAGNOSIS — E786 Lipoprotein deficiency: Principal | ICD-10-CM

## 2018-04-13 MED ORDER — ATORVASTATIN CALCIUM 40 MG PO TABS: ORAL_TABLET | ORAL | 0 refills | Status: DC

## 2018-04-14 NOTE — Telephone Encounter (Signed)
Duplicate encounter, rx for atorvastatin previously sent in.

## 2018-05-28 ENCOUNTER — Other Ambulatory Visit: Payer: Self-pay | Admitting: Family Medicine

## 2018-05-28 DIAGNOSIS — Z1159 Encounter for screening for other viral diseases: Secondary | ICD-10-CM

## 2018-05-28 DIAGNOSIS — E782 Mixed hyperlipidemia: Secondary | ICD-10-CM

## 2018-05-28 DIAGNOSIS — Z114 Encounter for screening for human immunodeficiency virus [HIV]: Secondary | ICD-10-CM

## 2018-05-28 DIAGNOSIS — I10 Essential (primary) hypertension: Secondary | ICD-10-CM

## 2018-05-28 DIAGNOSIS — Z125 Encounter for screening for malignant neoplasm of prostate: Secondary | ICD-10-CM

## 2018-05-28 DIAGNOSIS — R7303 Prediabetes: Secondary | ICD-10-CM

## 2018-05-28 DIAGNOSIS — Z5181 Encounter for therapeutic drug level monitoring: Secondary | ICD-10-CM

## 2018-05-28 NOTE — Telephone Encounter (Signed)
Copied from CRM [phone]. Topic: Quick Communication - Rx Refill/Question >> May 28, 2018  3:38 PM Floria RavelingStovall, Shana A wrote: Medication: losartan (COZAAR) 50 MG tablet [[id number]][[id number]]  Has the patient contacted their pharmacy? {no  (Agent: If no, request that the patient contact the pharmacy for the refill.) (Agent: If yes, when and what did the pharmacy advise?)  Preferred Pharm[facility] (with phone number [facility] [place] [facility] [address]   Agent: Please be advised that RX refills may take up to 3 business days. We ask that you follow-up with your pharmacy.

## 2018-05-29 DIAGNOSIS — R7303 Prediabetes: Secondary | ICD-10-CM | POA: Insufficient documentation

## 2018-05-29 DIAGNOSIS — Z5181 Encounter for therapeutic drug level monitoring: Secondary | ICD-10-CM | POA: Insufficient documentation

## 2018-05-29 MED ORDER — LOSARTAN POTASSIUM 50 MG PO TABS: ORAL_TABLET | ORAL | 0 refills | Status: DC

## 2018-05-29 NOTE — Assessment & Plan Note (Signed)
Check lipids 

## 2018-05-29 NOTE — Assessment & Plan Note (Signed)
check fasting glucose and A1c

## 2018-05-29 NOTE — Assessment & Plan Note (Signed)
Check Cr and K+ 

## 2018-05-29 NOTE — Telephone Encounter (Signed)
Patient has not had a creatinine or potassium checked since February of 2018 I'll approve a limited # of blood pressure pills, but will ask if he will come in this week JUST for fasting labs I really need to see his kidney function and he's overdue for A1c and lipids, so we'll just get them all now a few weeks before his scheduled appointment; we'll get his other overdue labs too, like prostate and hep c, hiv Thank you

## 2018-06-07 ENCOUNTER — Other Ambulatory Visit: Payer: Self-pay

## 2018-06-07 DIAGNOSIS — I10 Essential (primary) hypertension: Secondary | ICD-10-CM

## 2018-06-07 MED ORDER — LOSARTAN POTASSIUM 50 MG PO TABS: ORAL_TABLET | ORAL | 0 refills | Status: DC

## 2018-06-07 NOTE — Telephone Encounter (Signed)
Pt has appt 7/9

## 2018-06-07 NOTE — Telephone Encounter (Signed)
Please see the note from 05/28/2018 Ask him to get labs done this week and I'll refill his meds

## 2018-06-07 NOTE — Telephone Encounter (Signed)
Left detailed voicemail

## 2018-06-09 ENCOUNTER — Other Ambulatory Visit: Payer: Self-pay

## 2018-06-09 DIAGNOSIS — Z5181 Encounter for therapeutic drug level monitoring: Secondary | ICD-10-CM

## 2018-06-09 DIAGNOSIS — E782 Mixed hyperlipidemia: Secondary | ICD-10-CM

## 2018-06-09 DIAGNOSIS — Z125 Encounter for screening for malignant neoplasm of prostate: Secondary | ICD-10-CM

## 2018-06-09 DIAGNOSIS — R7303 Prediabetes: Secondary | ICD-10-CM

## 2018-06-09 DIAGNOSIS — Z1159 Encounter for screening for other viral diseases: Secondary | ICD-10-CM

## 2018-06-09 DIAGNOSIS — Z114 Encounter for screening for human immunodeficiency virus [HIV]: Secondary | ICD-10-CM

## 2018-06-11 ENCOUNTER — Encounter: Payer: Self-pay | Admitting: Family Medicine

## 2018-06-11 DIAGNOSIS — E786 Lipoprotein deficiency: Secondary | ICD-10-CM | POA: Insufficient documentation

## 2018-06-23 ENCOUNTER — Other Ambulatory Visit: Payer: Self-pay | Admitting: Family Medicine

## 2018-06-23 DIAGNOSIS — I10 Essential (primary) hypertension: Secondary | ICD-10-CM

## 2018-06-23 MED ORDER — LOSARTAN POTASSIUM 50 MG PO TABS: ORAL_TABLET | ORAL | 3 refills | Status: DC

## 2018-06-23 NOTE — Telephone Encounter (Signed)
Last Cr and K+ reviewed; Rx approved 

## 2018-06-23 NOTE — Telephone Encounter (Signed)
Refill request for Hypertension medication:  Losartan 50 mg  Last office visit pertaining to hypertension: 12/29/2017  BP Readings from Last 3 Encounters:  12/29/17 136/62  07/22/17 125/74  01/22/17 127/68     Lab Results  Component Value Date   CREATININE 0.90 06/09/2018   BUN 15 06/09/2018   NA 140 06/09/2018   K 4.3 06/09/2018   CL 107 06/09/2018   CO2 27 06/09/2018   Follow-ups on file. 06/29/2018

## 2018-06-29 ENCOUNTER — Ambulatory Visit (INDEPENDENT_AMBULATORY_CARE_PROVIDER_SITE_OTHER): Payer: Managed Care, Other (non HMO) | Admitting: Family Medicine

## 2018-06-29 ENCOUNTER — Encounter: Payer: Self-pay | Admitting: Family Medicine

## 2018-06-29 VITALS — BP 130/70 | HR 97 | Temp 98.4°F | Resp 18 | Ht 68.0 in | Wt 191.8 lb

## 2018-06-29 DIAGNOSIS — Z716 Tobacco abuse counseling: Secondary | ICD-10-CM

## 2018-06-29 DIAGNOSIS — E785 Hyperlipidemia, unspecified: Secondary | ICD-10-CM | POA: Diagnosis not present

## 2018-06-29 DIAGNOSIS — Z7982 Long term (current) use of aspirin: Secondary | ICD-10-CM | POA: Diagnosis not present

## 2018-06-29 DIAGNOSIS — E786 Lipoprotein deficiency: Secondary | ICD-10-CM | POA: Diagnosis not present

## 2018-06-29 DIAGNOSIS — R7303 Prediabetes: Secondary | ICD-10-CM

## 2018-06-29 DIAGNOSIS — I10 Essential (primary) hypertension: Secondary | ICD-10-CM | POA: Diagnosis not present

## 2018-06-29 DIAGNOSIS — E782 Mixed hyperlipidemia: Secondary | ICD-10-CM | POA: Diagnosis not present

## 2018-06-29 MED ORDER — ATORVASTATIN CALCIUM 40 MG PO TABS: ORAL_TABLET | ORAL | 3 refills | Status: DC

## 2018-06-29 NOTE — Progress Notes (Signed)
BP 130/70 (BP Location: Right Arm, Patient Position: Sitting, Cuff Size: Large)   Pulse 97   Temp 98.4 F (36.9 C) (Oral)   Resp 18   Ht 5\' 8"  (1.727 m)   Wt 191 lb 12.8 oz (87 kg)   SpO2 97%   BMI 29.16 kg/m    Subjective:    Patient ID: Travis Watson, male    DOB: Oct 24, 1956, 62 y.o.   MRN: [id number]  HPI: Travis Watson is a 62 y.o. male  Chief Complaint  Patient presents with  . Hyperlipidemia    follow up  . Hypertension    HPI Patient is here for f/u  Hypertension; does not use salt; runs in the family; checks BP away from the doctor, 130-140 over 70; uses wrist cuff; using losartan; no problems  High cholesterol; runs in the family; tried diet changes and cannot raise HDL even with exercise; using atorvastatin; no muscle aches  Prediabetes; barely on the edge of that; tries to not drink a lot of sweet tea  Reviewed other labs; all looked good  He is a smoker; not ready to quit; has been smoking for 50 years  [facility] 06/29/2018 12/29/2017 12/29/2017 07/22/2017 01/22/2017  Decreased Interest 0 0 0 0 0  Down, Depressed, Hopeless 0 0 0 0 0  PHQ - 2 Score 0 0 0 0 0  Altered sleeping 0 - - - -  Tired, decreased energy 0 - - - -  Change in appetite 0 - - - -  Feeling bad or failure about yourself  0 - - - -  Trouble concentrating 0 - - - -  Moving slowly or fidgety/restless 0 - - - -  Suicidal thoughts 0 - - - -  PHQ-9 Score 0 - - - -  Difficult doing work/chores Not difficult at all - - - -   Relevant past medical, surgical, family and social history reviewed Past Medical History:  Diagnosis Date  . Hyperlipidemia   . Hypertension    No past surgical history on file. Family History  Problem Relation Age of Onset  . Diabetes Mother   . Emphysema Mother   . Hyperlipidemia Father   . Cancer Father        prostate  . Heart disease Maternal Grandmother   . Hypertension Maternal Grandmother   . Heart disease Maternal  Grandfather   . Hypertension Maternal Grandfather   . Migraines Paternal Grandmother    Social History   Tobacco Use  . Smoking status: Current Every Day Smoker    Packs/day: 0.50    Types: Cigarettes  . Smokeless tobacco: Former NeurosurgeonUser    Types: Snuff  . Tobacco comment: quit smokeless tabacco about 40 years ago  Substance Use Topics  . Alcohol use: Yes    Alcohol/week: 0.0 oz    Comment: occasional  . Drug use: No    Interim medical history since last visit reviewed. Allergies and medications reviewed  Review of Systems  Respiratory: Negative for shortness of breath.   Cardiovascular: Negative for chest pain and leg swelling.   Per HPI unless specifically indicated above     Objective:    BP 130/70 (BP Location: Right Arm, Patient Position: Sitting, Cuff Size: Large)   Pulse 97   Temp 98.4 F (36.9 C) (Oral)   Resp 18   Ht 5\' 8"  (1.727 m)   Wt 191 lb 12.8 oz (87 kg)   SpO2 97%  BMI 29.16 kg/m   Wt Readings from Last 3 Encounters:  06/29/18 191 lb 12.8 oz (87 kg)  12/29/17 193 lb (87.5 kg)  07/22/17 191 lb 14.4 oz (87 kg)    Physical Exam  Constitutional: He appears well-developed and well-nourished. No distress.  HENT:  Head: Normocephalic and atraumatic.  Eyes: EOM are normal. No scleral icterus.  Neck: No thyromegaly present.  Cardiovascular: Normal rate and regular rhythm.  Pulmonary/Chest: Effort normal and breath sounds normal.  Abdominal: Soft. Bowel sounds are normal. He exhibits no distension.  Musculoskeletal: He exhibits no edema.  Neurological: Coordination normal.  Skin: Skin is warm and dry. No pallor.  Psychiatric: He has a normal mood and affect. His behavior is normal. Judgment and thought content normal.    Results for orders placed or performed in visit on 06/09/18  Hepatitis C antibody  Result Value Ref Range   Hepatitis C Ab NON-REACTIVE NON-REACTI   SIGNAL TO CUT-OFF 0.02 <1.00  HIV antibody  Result Value Ref Range   HIV 1&2  Ab, 4th Generation NON-REACTIVE NON-REACTI  PSA  Result Value Ref Range   PSA 0.9 < OR = 4.0 ng/mL  COMPLETE METABOLIC PANEL WITH GFR  Result Value Ref Range   Glucose, Bld 100 (H) 65 - 99 mg/dL   BUN 15 7 - 25 mg/dL   Creat 1.91 4.78 - 2.95 mg/dL   GFR, Est Non African American 92 > OR = 60 mL/min/1.33m2   GFR, Est African American 106 > OR = 60 mL/min/1.69m2   BUN/Creatinine Ratio NOT APPLICABLE 6 - 22 (calc)   Sodium 140 135 - 146 mmol/L   Potassium 4.3 3.5 - 5.3 mmol/L   Chloride 107 98 - 110 mmol/L   CO2 27 20 - 32 mmol/L   Calcium 9.0 8.6 - 10.3 mg/dL   Total Protein 6.8 6.1 - 8.1 g/dL   Albumin 4.0 3.6 - 5.1 g/dL   Globulin 2.8 1.9 - 3.7 g/dL (calc)   AG Ratio 1.4 1.0 - 2.5 (calc)   Total Bilirubin 0.6 0.2 - 1.2 mg/dL   Alkaline phosphatase (APISO) 58 40 - 115 U/L   AST 17 10 - 35 U/L   ALT 25 9 - 46 U/L  Lipid panel  Result Value Ref Range   Cholesterol 122 <200 mg/dL   HDL 26 (L) >62 mg/dL   Triglycerides 130 <865 mg/dL   LDL Cholesterol (Calc) 77 mg/dL (calc)   Total CHOL/HDL Ratio 4.7 <5.0 (calc)   Non-HDL Cholesterol (Calc) 96 <784 mg/dL (calc)  Hemoglobin O9G  Result Value Ref Range   Hgb A1c MFr Bld 5.7 (H) <5.7 % of total Hgb   Mean Plasma Glucose 117 (calc)   eAG (mmol/L) 6.5 (calc)      Assessment & Plan:   Problem List Items Addressed This Visit      Cardiovascular and Mediastinum   Hypertension    Try the DASH guidelines; continue medicine      Relevant Medications   atorvastatin (LIPITOR) 40 MG tablet   aspirin EC 81 MG tablet     Other   Tobacco abuse counseling    Smoking and not ready to quit; I am here to help if and when ready; order chest CT      Relevant Orders   CT CHEST LUNG CA SCREEN LOW DOSE W/O CM   Prediabetes    Barely at the edge, will just monitor every 6-12 months; limit sweet drinks      Low HDL (  under 40)    He is not ready to quit      Hyperlipidemia    Applauded patient's efforts; limit saturated fats,  continue statin      Relevant Medications   atorvastatin (LIPITOR) 40 MG tablet   aspirin EC 81 MG tablet    Other Visit Diagnoses    Hyperlipidemia with low HDL    -  Primary   Relevant Medications   atorvastatin (LIPITOR) 40 MG tablet   aspirin EC 81 MG tablet   Aspirin long-term use       advised aspirin daily 81 mg; used harvard guide, 17.4% risk of ASCVD in next 10 years; reviewed with patient today       Follow up plan: Return in about 6 months (around 12/30/2018) for follow-up visit with Dr. Sherie Don.  An after-visit summary was printed and given to the patient at check-out.  Please see the patient instructions which may contain other information and recommendations beyond what is mentioned above in the assessment and plan.  Meds ordered this encounter  Medications  . atorvastatin (LIPITOR) 40 MG tablet    Sig: TAKE 1 TABLET BY MOUTH EVERYDAY AT BEDTIME    Dispense:  90 tablet    Refill:  3    Orders Placed This Encounter  Procedures  . CT CHEST LUNG CA SCREEN LOW DOSE W/O CM

## 2018-06-29 NOTE — Assessment & Plan Note (Signed)
Try the DASH guidelines; continue medicine

## 2018-06-29 NOTE — Assessment & Plan Note (Signed)
He is not ready to quit 

## 2018-06-29 NOTE — Patient Instructions (Addendum)
Try to follow the DASH guidelines (DASH stands for Dietary Approaches to Stop Hypertension). Try to limit the sodium in your diet to no more than 1,500mg  of sodium per day. Certainly try to not exceed 2,000 mg per day at the very most. Do not add salt when cooking or at the table.  Check the sodium amount on labels when shopping, and choose items lower in sodium when given a choice. Avoid or limit foods that already contain a lot of sodium. Eat a diet rich in fruits and vegetables and whole grains, and try to lose weight if overweight or obese  Try to limit saturated fats in your diet (bologna, hot dogs, barbeque, cheeseburgers, hamburgers, steak, bacon, sausage, cheese, etc.) and get more fresh fruits, vegetables, and whole grains   DASH Eating Plan DASH stands for "Dietary Approaches to Stop Hypertension." The DASH eating plan is a healthy eating plan that has been shown to reduce high blood pressure (hypertension). It may also reduce your risk for type 2 diabetes, heart disease, and stroke. The DASH eating plan may also help with weight loss. What are tips for following this plan? General guidelines  Avoid eating more than 2,300 mg (milligrams) of salt (sodium) a day. If you have hypertension, you may need to reduce your sodium intake to 1,500 mg a day.  Limit alcohol intake to no more than 1 drink a day for nonpregnant women and 2 drinks a day for men. One drink equals 12 oz of beer, 5 oz of wine, or 1 oz of hard liquor.  Work with your health care provider to maintain a healthy body weight or to lose weight. Ask what an ideal weight is for you.  Get at least 30 minutes of exercise that causes your heart to beat faster (aerobic exercise) most days of the week. Activities may include walking, swimming, or biking.  Work with your health care provider or diet and nutrition specialist (dietitian) to adjust your eating plan to your individual calorie needs. Reading food labels  Check food labels  for the amount of sodium per serving. Choose foods with less than 5 percent of the Daily Value of sodium. Generally, foods with less than 300 mg of sodium per serving fit into this eating plan.  To find whole grains, look for the word "whole" as the first word in the ingredient list. Shopping  Buy products labeled as "low-sodium" or "no salt added."  Buy fresh foods. Avoid canned foods and premade or frozen meals. Cooking  Avoid adding salt when cooking. Use salt-free seasonings or herbs instead of table salt or sea salt. Check with your health care provider or pharmacist before using salt substitutes.  Do not fry foods. Cook foods using healthy methods such as baking, boiling, grilling, and broiling instead.  Cook with heart-healthy oils, such as olive, canola, soybean, or sunflower oil. Meal planning   Eat a balanced diet that includes: ? 5 or more servings of fruits and vegetables each day. At each meal, try to fill half of your plate with fruits and vegetables. ? Up to 6-8 servings of whole grains each day. ? Less than 6 oz of lean meat, poultry, or fish each day. A 3-oz serving of meat is about the same size as a deck of cards. One egg equals 1 oz. ? 2 servings of low-fat dairy each day. ? A serving of nuts, seeds, or beans 5 times each week. ? Heart-healthy fats. Healthy fats called Omega-3 fatty acids are found  in foods such as flaxseeds and coldwater fish, like sardines, salmon, and mackerel.  Limit how much you eat of the following: ? Canned or prepackaged foods. ? Food that is high in trans fat, such as fried foods. ? Food that is high in saturated fat, such as fatty meat. ? Sweets, desserts, sugary drinks, and other foods with added sugar. ? Full-fat dairy products.  Do not salt foods before eating.  Try to eat at least 2 vegetarian meals each week.  Eat more home-cooked food and less restaurant, buffet, and fast food.  When eating at a restaurant, ask that your food  be prepared with less salt or no salt, if possible. What foods are recommended? The items listed may not be a complete list. Talk with your dietitian about what dietary choices are best for you. Grains Whole-grain or whole-wheat bread. Whole-grain or whole-wheat pasta. Brown rice. [place]. Bulgur. Whole-grain and low-sodium cereals. Pita bread. Low-fat, low-sodium crackers. Whole-wheat flour tortillas. Vegetables Fresh or frozen vegetables (raw, steamed, roasted, or grilled). Low-sodium or reduced-sodium tomato and vegetable juice. Low-sodium or reduced-sodium tomato sauce and tomato paste. Low-sodium or reduced-sodium canned vegetables. Fruits All fresh, dried, or frozen fruit. Canned fruit in natural juice (without added sugar). Meat and other protein foods Skinless chicken or [place]. Ground chicken or [place]. Pork with fat trimmed off. Fish and seafood. Egg whites. Dried beans, peas, or lentils. Unsalted nuts, nut butters, and seeds. Unsalted canned beans. Lean cuts of beef with fat trimmed off. Low-sodium, lean deli meat. Dairy Low-fat (1%) or fat-free (skim) milk. Fat-free, low-fat, or reduced-fat cheeses. Nonfat, low-sodium ricotta or cottage cheese. Low-fat or nonfat yogurt. Low-fat, low-sodium cheese. Fats and oils Soft margarine without trans fats. Vegetable oil. Low-fat, reduced-fat, or light mayonnaise and salad dressings (reduced-sodium). Canola, safflower, olive, soybean, and sunflower oils. Avocado. Seasoning and other foods Herbs. Spices. Seasoning mixes without salt. Unsalted popcorn and pretzels. Fat-free sweets. What foods are not recommended? The items listed may not be a complete list. Talk with your dietitian about what dietary choices are best for you. Grains Baked goods made with fat, such as croissants, muffins, or some breads. Dry pasta or rice meal packs. Vegetables Creamed or fried vegetables. Vegetables in a cheese sauce. Regular canned vegetables (not  low-sodium or reduced-sodium). Regular canned tomato sauce and paste (not low-sodium or reduced-sodium). Regular tomato and vegetable juice (not low-sodium or reduced-sodium). Angie Fava. Olives. Fruits Canned fruit in a light or heavy syrup. Fried fruit. Fruit in cream or butter sauce. Meat and other protein foods Fatty cuts of meat. Ribs. Fried meat. Berniece Salines. Sausage. Bologna and other processed lunch meats. Salami. Fatback. Hotdogs. Bratwurst. Salted nuts and seeds. Canned beans with added salt. Canned or smoked fish. Whole eggs or egg yolks. Chicken or [place] with skin. Dairy Whole or 2% milk, cream, and half-and-half. Whole or full-fat cream cheese. Whole-fat or sweetened yogurt. Full-fat cheese. Nondairy creamers. Whipped toppings. Processed cheese and cheese spreads. Fats and oils Butter. Stick margarine. Lard. Shortening. Ghee. Bacon fat. Tropical oils, such as coconut, palm kernel, or palm oil. Seasoning and other foods Salted popcorn and pretzels. Onion salt, garlic salt, seasoned salt, table salt, and sea salt. Worcestershire sauce. Tartar sauce. Barbecue sauce. Teriyaki sauce. Soy sauce, including reduced-sodium. Steak sauce. Canned and packaged gravies. Fish sauce. Oyster sauce. Cocktail sauce. Horseradish that you find on the shelf. Ketchup. Mustard. Meat flavorings and tenderizers. Bouillon cubes. Hot sauce and Tabasco sauce. Premade or packaged marinades. Premade or packaged taco seasonings. Relishes.  Regular salad dressings. Where to find more information:  National Heart, Lung, and Blood Institute: PopSteam.is  American Heart Association: www.heart.org Summary  The DASH eating plan is a healthy eating plan that has been shown to reduce high blood pressure (hypertension). It may also reduce your risk for type 2 diabetes, heart disease, and stroke.  With the DASH eating plan, you should limit salt (sodium) intake to 2,300 mg a day. If you have hypertension, you may need to reduce  your sodium intake to 1,500 mg a day.  When on the DASH eating plan, aim to eat more fresh fruits and vegetables, whole grains, lean proteins, low-fat dairy, and heart-healthy fats.  Work with your health care provider or diet and nutrition specialist (dietitian) to adjust your eating plan to your individual calorie needs. This information is not intended to replace advice given to you by your health care provider. Make sure you discuss any questions you have with your health care provider. Document Released: 11/27/2011 Document Revised: 12/01/2016 Document Reviewed: 12/01/2016 Elsevier Interactive Patient Education  [employer].  I do encourage you to quit smoking Call 334 616 4310 to sign up for smoking cessation classes You can call 1-800-QUIT-NOW to talk with a smoking cessation coach  Steps to Quit Smoking Smoking tobacco can be bad for your health. It can also affect almost every organ in your body. Smoking puts you and people around you at risk for many serious long-lasting (chronic) diseases. Quitting smoking is hard, but it is one of the best things that you can do for your health. It is never too late to quit. What are the benefits of quitting smoking? When you quit smoking, you lower your risk for getting serious diseases and conditions. They can include:  Lung cancer or lung disease.  Heart disease.  Stroke.  Heart attack.  Not being able to have children (infertility).  Weak bones (osteoporosis) and broken bones (fractures).  If you have coughing, wheezing, and shortness of breath, those symptoms may get better when you quit. You may also get sick less often. If you are pregnant, quitting smoking can help to lower your chances of having a baby of low birth weight. What can I do to help me quit smoking? Talk with your doctor about what can help you quit smoking. Some things you can do (strategies) include:  Quitting smoking totally, instead of slowly cutting back  how much you smoke over a period of time.  Going to in-person counseling. You are more likely to quit if you go to many counseling sessions.  Using resources and support systems, such as: ? Agricultural engineer with a Veterinary surgeon. ? Phone quitlines. ? Automotive engineer. ? Support groups or group counseling. ? Text messaging programs. ? Mobile phone apps or applications.  Taking medicines. Some of these medicines may have nicotine in them. If you are pregnant or breastfeeding, do not take any medicines to quit smoking unless your doctor says it is okay. Talk with your doctor about counseling or other things that can help you.  Talk with your doctor about using more than one strategy at the same time, such as taking medicines while you are also going to in-person counseling. This can help make quitting easier. What things can I do to make it easier to quit? Quitting smoking might feel very hard at first, but there is a lot that you can do to make it easier. Take these steps:  Talk to your family and friends. Ask them to  support and encourage you.  Call phone quitlines, reach out to support groups, or work with a Veterinary surgeoncounselor.  Ask people who smoke to not smoke around you.  Avoid places that make you want (trigger) to smoke, such as: ? Bars. ? Parties. ? Smoke-break areas at work.  Spend time with people who do not smoke.  Lower the stress in your life. Stress can make you want to smoke. Try these things to help your stress: ? Getting regular exercise. ? Deep-breathing exercises. ? Yoga. ? Meditating. ? Doing a body scan. To do this, close your eyes, focus on one area of your body at a time from head to toe, and notice which parts of your body are tense. Try to relax the muscles in those areas.  Download or buy apps on your mobile phone or tablet that can help you stick to your quit plan. There are many free apps, such as QuitGuide from the [employer] developer(Centers for Disease Control and  Prevention). You can find more support from smokefree.gov and other websites.  This information is not intended to replace advice given to you by your health care provider. Make sure you discuss any questions you have with your health care provider. Document Released: 10/04/2009 Document Revised: 08/05/2016 Document Reviewed: 04/24/2015 Elsevier Interactive Patient Education  2018 [employer]

## 2018-06-29 NOTE — Assessment & Plan Note (Signed)
Smoking and not ready to quit; I am here to help if and when ready; order chest CT

## 2018-06-29 NOTE — Assessment & Plan Note (Signed)
Barely at the edge, will just monitor every 6-12 months; limit sweet drinks

## 2018-06-29 NOTE — Assessment & Plan Note (Signed)
Applauded patient's efforts; limit saturated fats, continue statin

## 2018-07-19 ENCOUNTER — Telehealth: Payer: Self-pay | Admitting: *Deleted

## 2018-07-19 NOTE — Telephone Encounter (Signed)
Received referral for low dose lung cancer screening CT scan. Message left at phone number listed in EMR for patient to call me back to facilitate scheduling scan.  

## 2018-07-21 ENCOUNTER — Telehealth: Payer: Self-pay | Admitting: *Deleted

## 2018-07-21 DIAGNOSIS — Z87891 Personal history of nicotine dependence: Secondary | ICD-10-CM

## 2018-07-21 DIAGNOSIS — Z122 Encounter for screening for malignant neoplasm of respiratory organs: Secondary | ICD-10-CM

## 2018-07-21 NOTE — Telephone Encounter (Signed)
Received referral for initial lung cancer screening scan. Contacted patient and obtained smoking history,(current, 49 pack year) as well as answering questions related to screening process. Patient denies signs of lung cancer such as weight loss or hemoptysis. Patient denies comorbidity that would prevent curative treatment if lung cancer were found. Patient is scheduled for shared decision making visit and CT scan on 08/13/18.

## 2018-08-13 ENCOUNTER — Encounter: Payer: Self-pay | Admitting: Family Medicine

## 2018-08-13 ENCOUNTER — Telehealth: Payer: Self-pay | Admitting: Family Medicine

## 2018-08-13 ENCOUNTER — Ambulatory Visit
Admission: RE | Admit: 2018-08-13 | Discharge: 2018-08-13 | Disposition: A | Discharge status: Home / Self Care | Payer: Managed Care, Other (non HMO) | Source: Ambulatory Visit | Attending: Oncology | Admitting: Oncology

## 2018-08-13 ENCOUNTER — Inpatient Hospital Stay: Payer: Managed Care, Other (non HMO) | Attending: Oncology | Admitting: Oncology

## 2018-08-13 ENCOUNTER — Encounter: Payer: Self-pay | Admitting: Oncology

## 2018-08-13 DIAGNOSIS — Z122 Encounter for screening for malignant neoplasm of respiratory organs: Secondary | ICD-10-CM

## 2018-08-13 DIAGNOSIS — N289 Disorder of kidney and ureter, unspecified: Secondary | ICD-10-CM | POA: Diagnosis not present

## 2018-08-13 DIAGNOSIS — F17210 Nicotine dependence, cigarettes, uncomplicated: Secondary | ICD-10-CM

## 2018-08-13 DIAGNOSIS — Z87891 Personal history of nicotine dependence: Secondary | ICD-10-CM

## 2018-08-13 DIAGNOSIS — J438 Other emphysema: Secondary | ICD-10-CM

## 2018-08-13 HISTORY — DX: Other emphysema: J43.8

## 2018-08-13 NOTE — Telephone Encounter (Signed)
2. Incompletely visualized 2.1 cm exophytic lesion upper pole right kidney with attenuation too high to be a simple cyst. Abdominal MRI without and with contrast recommended to further evaluate.  ------------------------------------- I talked to pt about results of chest CT New diagnosis of emphysema; encouraged quitting New lesion on right kidney; he denies hematuria Please call [phone] to schedule your imaging test (MRI abdomen) Please wait 2-3 days after the order has been placed to call and get your test scheduled If he has the MRI done and doesn't hear back in one week, please call, as I'll be away I am here to help him quit smoking if needed

## 2018-08-13 NOTE — Progress Notes (Signed)
In accordance with CMS guidelines, patient has met eligibility criteria including age, absence of signs or symptoms of lung cancer.  Social History   Tobacco Use  . Smoking status: Current Every Day Smoker    Packs/day: 1.00    Years: 49.00    Pack years: 49.00    Types: Cigarettes  . Smokeless tobacco: Former Systems developer    Types: Snuff  . Tobacco comment: quit smokeless tabacco about 40 years ago  Substance Use Topics  . Alcohol use: Yes    Alcohol/week: 0.0 standard drinks    Comment: occasional  . Drug use: No     A shared decision-making session was conducted prior to the performance of CT scan. This includes one or more decision aids, includes benefits and harms of screening, follow-up diagnostic testing, over-diagnosis, false positive rate, and total radiation exposure.  Counseling on the importance of adherence to annual lung cancer LDCT screening, impact of co-morbidities, and ability or willingness to undergo diagnosis and treatment is imperative for compliance of the program.  Counseling on the importance of continued smoking cessation for former smokers; the importance of smoking cessation for current smokers, and information about tobacco cessation interventions have been given to patient including [place] and 1800 quit [place] programs.  Written order for lung cancer screening with LDCT has been given to the patient and any and all questions have been answered to the best of my abilities.   Yearly follow up will be coordinated by Burgess Estelle, Thoracic Navigator.  Faythe Casa, NP 08/13/2018 12:37 PM

## 2018-08-13 NOTE — Assessment & Plan Note (Signed)
Order abd MRI per radiology recommendation

## 2018-08-13 NOTE — Assessment & Plan Note (Signed)
Noted on chest CT Aug 2019

## 2018-08-17 ENCOUNTER — Telehealth: Payer: Self-pay | Admitting: *Deleted

## 2018-08-17 NOTE — Telephone Encounter (Signed)
error 

## 2018-09-01 ENCOUNTER — Ambulatory Visit
Admission: RE | Admit: 2018-09-01 | Discharge: 2018-09-01 | Disposition: A | Discharge status: Home / Self Care | Payer: Managed Care, Other (non HMO) | Source: Ambulatory Visit | Attending: Family Medicine | Admitting: Family Medicine

## 2018-09-01 DIAGNOSIS — I700 Atherosclerosis of aorta: Secondary | ICD-10-CM | POA: Insufficient documentation

## 2018-09-01 DIAGNOSIS — N281 Cyst of kidney, acquired: Secondary | ICD-10-CM | POA: Diagnosis not present

## 2018-09-01 DIAGNOSIS — N289 Disorder of kidney and ureter, unspecified: Secondary | ICD-10-CM | POA: Insufficient documentation

## 2018-09-01 LAB — POCT I-STAT CREATININE: CREATININE: 0.9 mg/dL (ref 0.61–1.24)

## 2018-09-01 MED ORDER — GADOBENATE DIMEGLUMINE 529 MG/ML IV SOLN
20.0000 mL | Freq: Once | INTRAVENOUS | Status: AC | PRN
  Administered 2018-09-01: 18 mL via INTRAVENOUS

## 2018-09-08 ENCOUNTER — Telehealth: Payer: Self-pay | Admitting: Family Medicine

## 2018-09-08 NOTE — Telephone Encounter (Signed)
Discussed pseudotumor of kidney; will see what urologist says if referral needed, close the book, rescan in 6-12 months, or need appt

## 2018-09-11 ENCOUNTER — Other Ambulatory Visit: Payer: Self-pay | Admitting: Family Medicine

## 2018-09-11 DIAGNOSIS — R9341 Abnormal radiologic findings on diagnostic imaging of renal pelvis, ureter, or bladder: Secondary | ICD-10-CM

## 2018-09-11 DIAGNOSIS — N281 Cyst of kidney, acquired: Secondary | ICD-10-CM

## 2018-09-11 NOTE — Progress Notes (Signed)
Refer to Dr. Apolinar JunesBrandon

## 2018-09-21 LAB — HEMOGLOBIN A1C
EAG (MMOL/L): 6.5 (calc)
Hgb A1c MFr Bld: 5.7 % of total Hgb — ABNORMAL HIGH (ref <5.7)
MEAN PLASMA GLUCOSE: 117 (calc)

## 2018-09-21 LAB — LIPID PANEL
CHOLESTEROL: 122 mg/dL (ref <200)
HDL: 26 mg/dL — AB (ref >40)
LDL Cholesterol (Calc): 77 mg/dL (calc)
NON-HDL CHOLESTEROL (CALC): 96 mg/dL (ref <130)
TRIGLYCERIDES: 102 mg/dL (ref <150)
Total CHOL/HDL Ratio: 4.7 (calc) (ref <5.0)

## 2018-09-21 LAB — COMPLETE METABOLIC PANEL WITH GFR
AG Ratio: 1.4 (calc) (ref 1.0–2.5)
ALKALINE PHOSPHATASE (APISO): 58 U/L (ref 40–115)
ALT: 25 U/L (ref 9–46)
AST: 17 U/L (ref 10–35)
Albumin: 4 g/dL (ref 3.6–5.1)
BUN: 15 mg/dL (ref 7–25)
CALCIUM: 9 mg/dL (ref 8.6–10.3)
CHLORIDE: 107 mmol/L (ref 98–110)
CO2: 27 mmol/L (ref 20–32)
Creat: 0.9 mg/dL (ref 0.70–1.25)
GFR, EST NON AFRICAN AMERICAN: 92 mL/min/{1.73_m2} (ref >=60)
GFR, Est African American: 106 mL/min/{1.73_m2} (ref >=60)
Globulin: 2.8 g/dL (calc) (ref 1.9–3.7)
Glucose, Bld: 100 mg/dL — ABNORMAL HIGH (ref 65–99)
Potassium: 4.3 mmol/L (ref 3.5–5.3)
Sodium: 140 mmol/L (ref 135–146)
Total Bilirubin: 0.6 mg/dL (ref 0.2–1.2)
Total Protein: 6.8 g/dL (ref 6.1–8.1)

## 2018-09-21 LAB — HIV ANTIBODY (ROUTINE TESTING W REFLEX): HIV 1&2 Ab, 4th Generation: NONREACTIVE

## 2018-09-21 LAB — PSA: PSA: 0.9 ng/mL (ref <=4.0)

## 2018-09-21 LAB — HEPATITIS C ANTIBODY
Hepatitis C Ab: NONREACTIVE
SIGNAL TO CUT-OFF: 0.02 (ref <1.00)

## 2018-10-19 ENCOUNTER — Encounter: Payer: Self-pay | Admitting: Urology

## 2018-10-19 ENCOUNTER — Ambulatory Visit (INDEPENDENT_AMBULATORY_CARE_PROVIDER_SITE_OTHER): Payer: 59 | Admitting: Urology

## 2018-10-19 VITALS — BP 133/69 | HR 73 | Ht 68.0 in | Wt 187.7 lb

## 2018-10-19 DIAGNOSIS — N281 Cyst of kidney, acquired: Secondary | ICD-10-CM

## 2018-10-19 NOTE — Progress Notes (Signed)
10/19/2018 1:09 PM   Hassel Neth III 09-May-1956 [id number]  Referring provider: Kerman Passey, MD [address] [place], [place] [address]  CC: Right renal mass  HPI: I had the pleasure of seeing Mr. Sayre in urology clinic today in consultation for possible right renal mass from Dr. Sherie Don.  He is a 62 year old healthy male with an extensive smoking history who underwent a screening CT chest to evaluate for lung cancer.  CT showed a possible 2 cm exophytic lesion in the upper pole of the right kidney, and radiology recommended an MRI.  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  Additionally, there was a 1cm proteinaceous cyst in the upper pole of the right kidney.  He denies any history of gross hematuria, or family history of bladder or kidney cancer..  There are no aggravating or alleviating factors.  He is completely asymptomatic.  Duration is since August 2019.   PMH: Past Medical History:  Diagnosis Date  . Hyperlipidemia   . Hypertension   . Paraseptal emphysema (HCC) 08/13/2018    Surgical History: No past surgical history on file.  Allergies: No Known Allergies  Family History: Family History  Problem Relation Age of Onset  . Diabetes Mother   . Emphysema Mother   . Hyperlipidemia Father   . Cancer Father        prostate  . Heart disease Maternal Grandmother   . Hypertension Maternal Grandmother   . Heart disease Maternal Grandfather   . Hypertension Maternal Grandfather   . Migraines Paternal Grandmother     Social History:  reports that he has been smoking cigarettes. He has a 49.00 pack-year smoking history. He has quit using smokeless tobacco.  His smokeless tobacco use included snuff. He reports that he drinks alcohol. He reports that he does not use drugs.  ROS: Please see flowsheet from today's date for  complete review of systems.  Physical Exam: BP 133/69 (BP Location: Left Arm, Patient Position: Sitting, Cuff Size: Normal)   Pulse 73   Ht 5\' 8"  (1.727 m)   Wt 187 lb 11.2 oz (85.1 kg)   BMI 28.54 kg/m    Constitutional:  Alert and oriented, No acute distress. Cardiovascular: No clubbing, cyanosis, or edema. Respiratory: Normal respiratory effort, no increased work of breathing. GI: Abdomen is soft, nontender, nondistended, no abdominal masses GU: No CVA tenderness Lymph: No cervical or inguinal lymphadenopathy. Skin: No rashes, bruises or suspicious lesions. Neurologic: Grossly intact, no focal deficits, moving all 4 extremities. Psychiatric: Normal mood and affect.  Laboratory Data: Serum creatinine 0.9  Pertinent Imaging: I have personally reviewed the CT and MRI.  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  This is a benign lesion and does not require surveillance.  Assessment & Plan:   In summary, Mr. Feil is a healthy 62 year old male with extensive smoking history and a 2 cm exophytic lesion of the right kidney incidentally seen on CT of the chest, with follow-up MRI confirming a benign "pseudotumor" that does not require further work-up.  We discussed at length this is a benign finding and does not require further work-up or surveillance in the future.  I did counsel him to consider quitting smoking as this increases his risk of both kidney and bladder cancer, as well as other malignancies.  Follow-up as needed  Sondra Come, MD  [facility] [address] [place], [place] [address] [phone]

## 2018-12-27 ENCOUNTER — Telehealth: Payer: Self-pay

## 2018-12-27 ENCOUNTER — Other Ambulatory Visit: Payer: Self-pay | Admitting: Family Medicine

## 2018-12-27 MED ORDER — LOSARTAN POTASSIUM 25 MG PO TABS: 50.0000 mg | ORAL_TABLET | Freq: Every day | ORAL | 0 refills | Status: DC

## 2018-12-27 NOTE — Progress Notes (Signed)
Note received from pharmacy, 50 mg on back order and needed either 25 mg or 100 mg Two 25 mg = 50 mg New Rx sent

## 2018-12-28 NOTE — Telephone Encounter (Signed)
erroneously encounter.

## 2018-12-30 ENCOUNTER — Ambulatory Visit (INDEPENDENT_AMBULATORY_CARE_PROVIDER_SITE_OTHER): Payer: 59 | Admitting: Family Medicine

## 2018-12-30 ENCOUNTER — Encounter: Payer: Self-pay | Admitting: Family Medicine

## 2018-12-30 DIAGNOSIS — R7303 Prediabetes: Secondary | ICD-10-CM

## 2018-12-30 DIAGNOSIS — I10 Essential (primary) hypertension: Secondary | ICD-10-CM

## 2018-12-30 DIAGNOSIS — J438 Other emphysema: Secondary | ICD-10-CM

## 2018-12-30 DIAGNOSIS — E782 Mixed hyperlipidemia: Secondary | ICD-10-CM

## 2018-12-30 DIAGNOSIS — Z5181 Encounter for therapeutic drug level monitoring: Secondary | ICD-10-CM

## 2018-12-30 DIAGNOSIS — Z86010 Personal history of colonic polyps: Secondary | ICD-10-CM

## 2018-12-30 NOTE — Assessment & Plan Note (Signed)
So glad he has been avoiding saturated fats; check lipids; continue statin

## 2018-12-30 NOTE — Assessment & Plan Note (Signed)
Check liver and kidneys 

## 2018-12-30 NOTE — Assessment & Plan Note (Signed)
Check A1c; he is aware of symptoms

## 2018-12-30 NOTE — Progress Notes (Signed)
BP 122/78   Pulse 99   Temp 98.2 F (36.8 C)   Ht 5\' 8"  (1.727 m)   Wt 184 lb 1.6 oz (83.5 kg)   SpO2 99%   BMI 27.99 kg/m    Subjective:    Patient ID: Travis Watson, male    DOB: 03/15/1956, 63 y.o.   MRN: [id number]  HPI: ROMI EDGECOMB Watson is a 63 y.o. male  Chief Complaint  Patient presents with  . Follow-up    HPI  HTN He did get the 25 mg losartan and taking two at a time; BP controlled; checks occasionally and nothing out of the ordinary; limits salt; not taking decongestants; does not buy chips  High cholesterol Not many processed foods; avoiding fatty meats; on statin  Prediabetes Mother's side has diabetes; no dry mouth Lab Results  Component Value Date   HGBA1C 5.7 (H) 06/09/2018   Smoking Trying to quit; going to do it my way he says; cold [place] maybe when he is ready; down to less than 1/2 ppd; getting yearly chest CT   Depression screen [facility] 2/9 12/30/2018 06/29/2018 12/29/2017 12/29/2017 07/22/2017  Decreased Interest 0 0 0 0 0  Down, Depressed, Hopeless 0 0 0 0 0  PHQ - 2 Score 0 0 0 0 0  Altered sleeping 0 0 - - -  Tired, decreased energy 0 0 - - -  Change in appetite 0 0 - - -  Feeling bad or failure about yourself  0 0 - - -  Trouble concentrating 0 0 - - -  Moving slowly or fidgety/restless 0 0 - - -  Suicidal thoughts 0 0 - - -  PHQ-9 Score 0 0 - - -  Difficult doing work/chores - Not difficult at all - - -   Fall Risk  12/30/2018 12/29/2017 07/22/2017 01/22/2017 06/25/2016  Falls in the past year? 0 No No No No    Relevant past medical, surgical, family and social history reviewed Past Medical History:  Diagnosis Date  . Hyperlipidemia   . Hypertension   . Paraseptal emphysema (HCC) 08/13/2018   History reviewed. No pertinent surgical history. Family History  Problem Relation Age of Onset  . Diabetes Mother   . Emphysema Mother   . Hyperlipidemia Father   . Cancer Father        prostate  . Heart disease Maternal Grandmother     . Hypertension Maternal Grandmother   . Heart disease Maternal Grandfather   . Hypertension Maternal Grandfather   . Migraines Paternal Grandmother    Social History   Tobacco Use  . Smoking status: Current Every Day Smoker    Packs/day: 1.00    Years: 49.00    Pack years: 49.00    Types: Cigarettes  . Smokeless tobacco: Former Neurosurgeon    Types: Snuff  . Tobacco comment: quit smokeless tabacco about 40 years ago  Substance Use Topics  . Alcohol use: Yes    Alcohol/week: 0.0 standard drinks    Comment: occasional  . Drug use: No     Office Visit from 12/30/2018 in [facility]  AUDIT-C Score  0      Interim medical history since last visit reviewed. Allergies and medications reviewed  Review of Systems  Constitutional: Negative for unexpected weight change.  Respiratory: Negative for shortness of breath and wheezing.   Cardiovascular: Negative for chest pain and palpitations.   Per HPI unless specifically indicated above  Objective:    BP 122/78   Pulse 99   Temp 98.2 F (36.8 C)   Ht 5\' 8"  (1.727 m)   Wt 184 lb 1.6 oz (83.5 kg)   SpO2 99%   BMI 27.99 kg/m   Wt Readings from Last 3 Encounters:  12/30/18 184 lb 1.6 oz (83.5 kg)  10/19/18 187 lb 11.2 oz (85.1 kg)  08/13/18 191 lb (86.6 kg)    Physical Exam Constitutional:      General: He is not in acute distress.    Appearance: He is well-developed.  HENT:     Head: Normocephalic and atraumatic.  Eyes:     General: No scleral icterus. Neck:     Thyroid: No thyromegaly.  Cardiovascular:     Rate and Rhythm: Normal rate and regular rhythm.  Pulmonary:     Effort: Pulmonary effort is normal.     Breath sounds: Normal breath sounds.  Abdominal:     General: Bowel sounds are normal. There is no distension.     Palpations: Abdomen is soft.  Skin:    General: Skin is warm and dry.     Coloration: Skin is not pale.  Neurological:     Coordination: Coordination normal.  Psychiatric:         Behavior: Behavior normal.        Thought Content: Thought content normal.        Judgment: Judgment normal.     Results for orders placed or performed during the hospital encounter of 09/01/18  I-STAT creatinine  Result Value Ref Range   Creatinine, Ser 0.90 0.61 - 1.24 mg/dL      Assessment & Plan:   Problem List Items Addressed This Visit      Cardiovascular and Mediastinum   Hypertension    Controlled; continue current med; avoid decongestants        Respiratory   Paraseptal emphysema (HCC)    Sounds good on exam today; he will consider quitting smoking on his own        Other   Prediabetes    Check A1c; he is aware of symptoms      Relevant Orders   Hemoglobin A1c   Medication monitoring encounter    Check liver and kidneys      Relevant Orders   COMPLETE METABOLIC PANEL WITH GFR   Hyperlipidemia    So glad he has been avoiding saturated fats; check lipids; continue statin      Relevant Orders   Lipid panel   Hx of colonic polyps    Patient not inclined to return to previous practice because of experience; encouraged him to see another GI clinic for colonoscopy and he will consider, not ready to commit right now; he will see what stool sample shows (to insurance company); patient will make sure results are reported          Follow up plan: Return in about 6 months (around 06/30/2019) for follow-up visit with Dr. Sherie Don.  An after-visit summary was printed and given to the patient at check-out.  Please see the patient instructions which may contain other information and recommendations beyond what is mentioned above in the assessment and plan.  No orders of the defined types were placed in this encounter.   Orders Placed This Encounter  Procedures  . COMPLETE METABOLIC PANEL WITH GFR  . Hemoglobin A1c  . Lipid panel

## 2018-12-30 NOTE — Assessment & Plan Note (Signed)
Controlled; continue current med; avoid decongestants

## 2018-12-30 NOTE — Assessment & Plan Note (Signed)
Patient not inclined to return to previous practice because of experience; encouraged him to see another GI clinic for colonoscopy and he will consider, not ready to commit right now; he will see what stool sample shows (to insurance company); patient will make sure results are reported

## 2018-12-30 NOTE — Assessment & Plan Note (Signed)
Sounds good on exam today; he will consider quitting smoking on his own

## 2018-12-30 NOTE — Patient Instructions (Addendum)
Try to use PLAIN allergy or cold medicine without the decongestant Avoid: phenylephrine, phenylpropanolamine, and pseudoephredine Try to follow the DASH guidelines (DASH stands for Dietary Approaches to Stop Hypertension). Try to limit the sodium in your diet to no more than 1,500mg  of sodium per day. Certainly try to not exceed 2,000 mg per day at the very most. Do not add salt when cooking or at the table.  Check the sodium amount on labels when shopping, and choose items lower in sodium when given a choice. Avoid or limit foods that already contain a lot of sodium. Eat a diet rich in fruits and vegetables and whole grains, and try to lose weight if overweight or obese Please consider a colonoscopy (I recommend it) I do encourage you to quit smoking Call [phone] to sign up for smoking cessation classes You can call 1-800-QUIT-NOW to talk with a smoking cessation coach

## 2018-12-31 LAB — COMPLETE METABOLIC PANEL WITH GFR
AG RATIO: 1.5 (calc) (ref 1.0–2.5)
ALBUMIN MSPROF: 4.3 g/dL (ref 3.6–5.1)
ALT: 23 U/L (ref 9–46)
AST: 15 U/L (ref 10–35)
Alkaline phosphatase (APISO): 62 U/L (ref 40–115)
BILIRUBIN TOTAL: 0.6 mg/dL (ref 0.2–1.2)
BUN: 13 mg/dL (ref 7–25)
CHLORIDE: 105 mmol/L (ref 98–110)
CO2: 27 mmol/L (ref 20–32)
Calcium: 9.5 mg/dL (ref 8.6–10.3)
Creat: 0.98 mg/dL (ref 0.70–1.25)
GFR, EST AFRICAN AMERICAN: 96 mL/min/{1.73_m2} (ref >=60)
GFR, Est Non African American: 83 mL/min/{1.73_m2} (ref >=60)
GLOBULIN: 2.8 g/dL (ref 1.9–3.7)
Glucose, Bld: 106 mg/dL (ref 65–139)
POTASSIUM: 4.7 mmol/L (ref 3.5–5.3)
Sodium: 140 mmol/L (ref 135–146)
TOTAL PROTEIN: 7.1 g/dL (ref 6.1–8.1)

## 2018-12-31 LAB — LIPID PANEL
Cholesterol: 133 mg/dL (ref <200)
HDL: 27 mg/dL — ABNORMAL LOW (ref >40)
LDL Cholesterol (Calc): 87 mg/dL (calc)
Non-HDL Cholesterol (Calc): 106 mg/dL (calc) (ref <130)
Total CHOL/HDL Ratio: 4.9 (calc) (ref <5.0)
Triglycerides: 95 mg/dL (ref <150)

## 2018-12-31 LAB — HEMOGLOBIN A1C
Hgb A1c MFr Bld: 5.9 % of total Hgb — ABNORMAL HIGH (ref <5.7)
Mean Plasma Glucose: 123 (calc)
eAG (mmol/L): 6.8 (calc)

## 2019-03-21 ENCOUNTER — Other Ambulatory Visit: Payer: Self-pay | Admitting: Family Medicine

## 2019-03-22 IMAGING — MR MR ABDOMEN WO/W CM
16 of 17 series · 42 of 48 positions shown · IV contrast (18 ML MULTIHANCE)
Comparison: 08/13/2018 chest CT.

CLINICAL DATA: Possible upper pole right renal mass on CT.
Asymptomatic.

EXAM:
MRI ABDOMEN WITHOUT AND WITH CONTRAST
TECHNIQUE: Multiplanar multisequence MR imaging of the abdomen was performed
both before and after the administration of intravenous contrast.
CONTRAST:  18mL MULTIHANCE GADOBENATE DIMEGLUMINE 529 MG/ML IV SOLN

[Series 3: cor ssfse / · coronal · 7.0mm · 1.48mm/px · 1 of 30 slices shown]
[im 1/30]
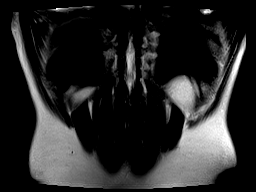

[Series 4: T1 · axial · 6.0mm · 0.74mm/px · z∈[-102,+92]mm · 2 of 56 slices shown]
[im 1/56]
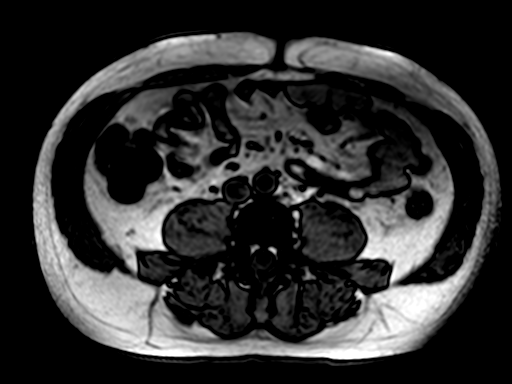
[im 56/56]
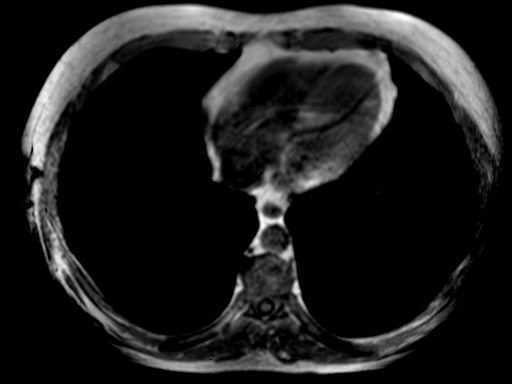

[Series 5: T2 fat-sat · axial · 6.0mm · 1.48mm/px · 1 of 28 slices shown]
[im 1/28]
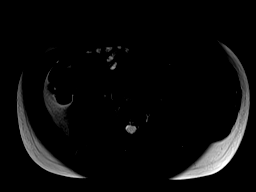

[Series 6: bSSFP · axial · 6.0mm · 0.74mm/px · z∈[-102,+92]mm · 2 of 28 slices shown]
[im 1/28]
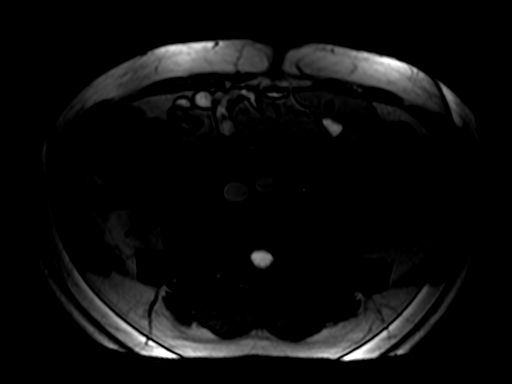
[im 28/28]
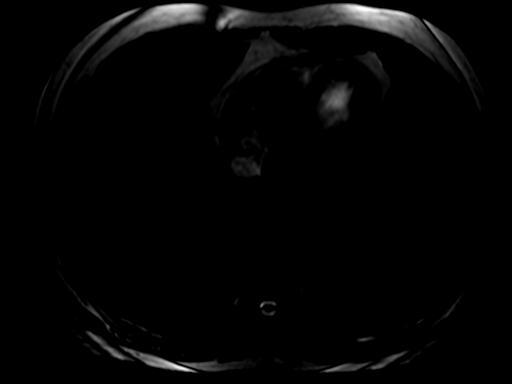

[Series 7: axial ssfse / · axial · 6.0mm · 1.19mm/px · z∈[-102,+92]mm · 2 of 28 slices shown]
[im 1/28]
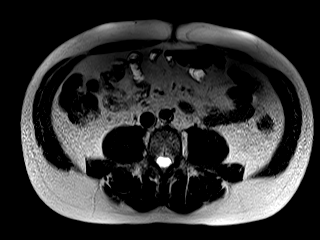
[im 28/28]
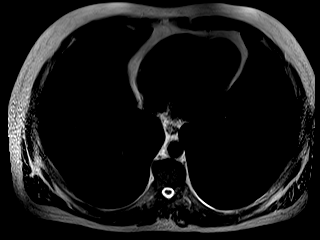

[Series 9: DWI · axial · 6.0mm · 2.00mm/px · z∈[-68,+119]mm · 5 of 81 slices shown (1 of 2)]
[im 1/81]
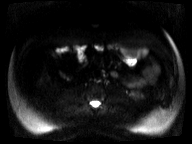
[im 21/81]
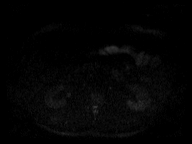
[im 41/81]
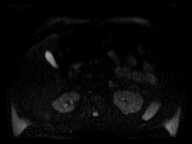
[im 61/81]
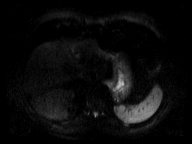
[im 81/81]
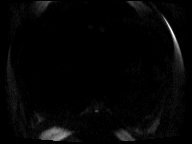

[Series 10: DWI · axial · 6.0mm · 2.00mm/px · z∈[-68,+119]mm · 2 of 27 slices shown (2 of 2)]
[im 1/27]
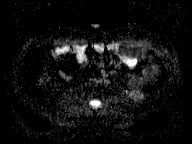
[im 27/27]
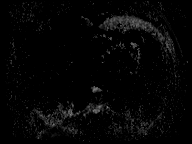

[Series 11: axial dynamic pre · axial · non-contrast · 4.0mm · 1.19mm/px · z∈[-86,+102]mm · 3 of 48 slices shown]
[im 1/48]
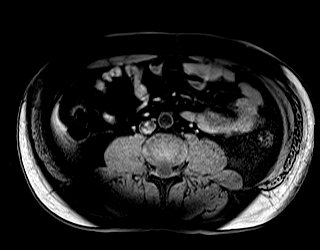
[im 24/48]
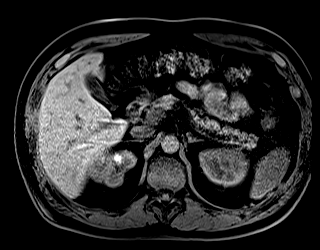
[im 48/48]
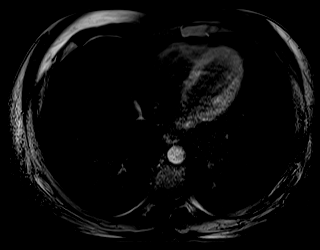

[Series 12: axial dynamic post · axial · 4.0mm · 1.19mm/px · z∈[-86,+102]mm · 3 of 48 slices shown (1 of 6)]
[im 1/48]
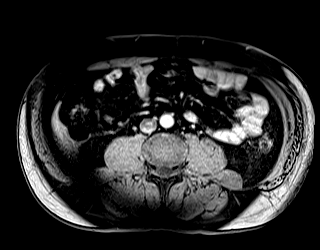
[im 24/48]
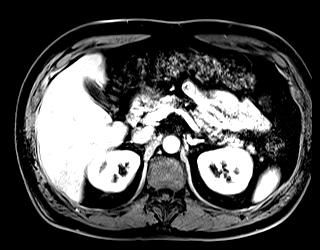
[im 48/48]
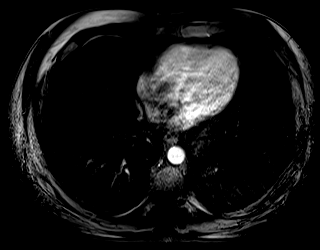

[Series 13: axial dynamic post · axial · 4.0mm · 1.19mm/px · z∈[-86,+102]mm · 3 of 48 slices shown (2 of 6)]
[im 1/48]
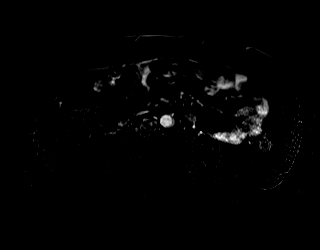
[im 24/48]
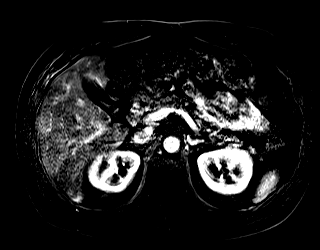
[im 48/48]
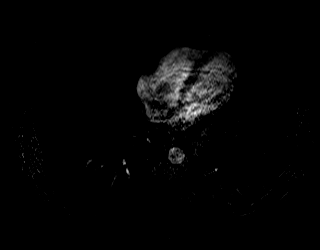

[Series 14: axial dynamic post · axial · 4.0mm · 1.19mm/px · z∈[-86,+102]mm · 3 of 48 slices shown (3 of 6)]
[im 1/48]
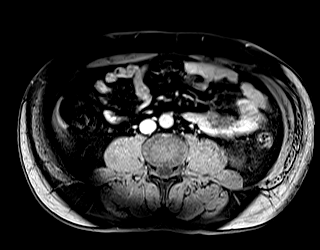
[im 24/48]
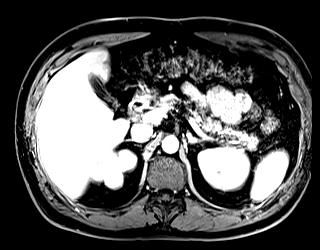
[im 48/48]
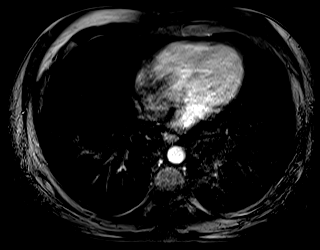

[Series 15: axial dynamic post · axial · 4.0mm · 1.19mm/px · z∈[-86,+102]mm · 3 of 48 slices shown (4 of 6)]
[im 1/48]
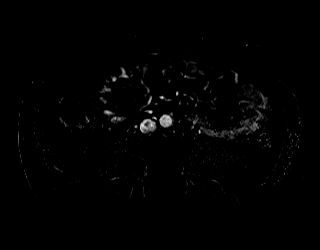
[im 24/48]
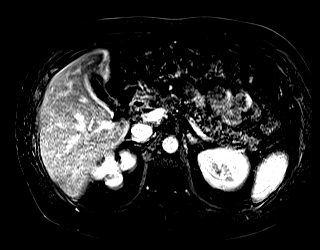
[im 48/48]
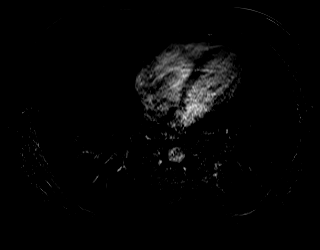

[Series 16: axial dynamic post · axial · 4.0mm · 1.19mm/px · z∈[-86,+102]mm · 3 of 48 slices shown (5 of 6)]
[im 1/48]
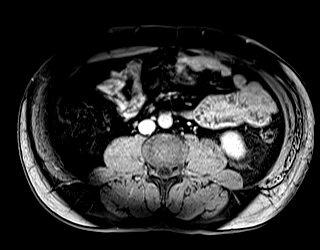
[im 24/48]
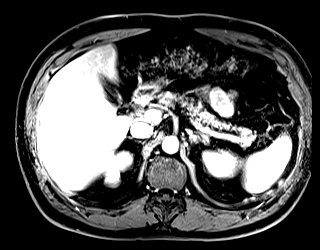
[im 48/48]
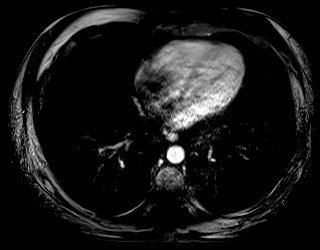

[Series 17: axial dynamic post · axial · 4.0mm · 1.19mm/px · z∈[-86,+102]mm · 3 of 48 slices shown (6 of 6)]
[im 1/48]
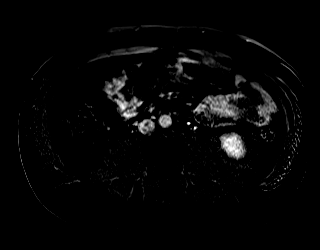
[im 24/48]
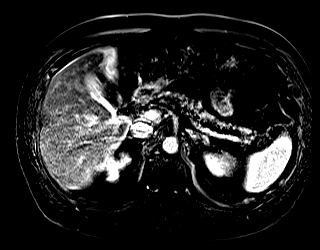
[im 48/48]
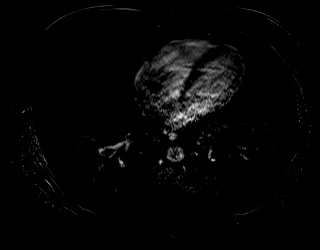

[Series 19: axial dynamic 3 · axial · 4.0mm · 1.19mm/px · z∈[-86,+102]mm · 3 of 48 slices shown (1 of 2)]
[im 1/48]
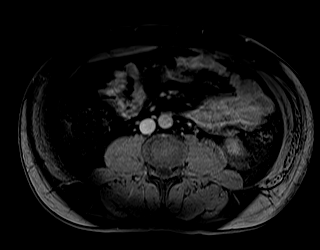
[im 24/48]
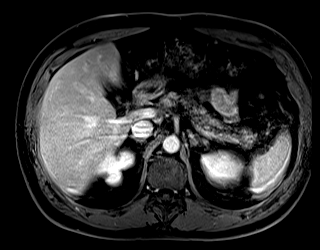
[im 48/48]
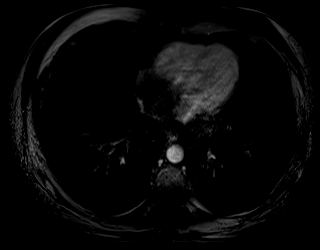

[Series 20: axial dynamic 3 · axial · 4.0mm · 1.19mm/px · z∈[-86,+102]mm · 3 of 48 slices shown (2 of 2)]
[im 1/48]
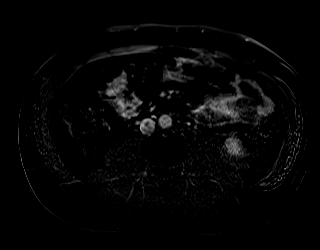
[im 24/48]
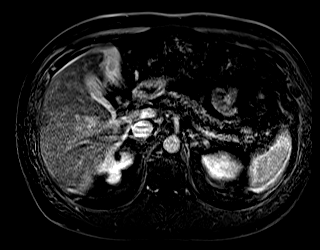
[im 48/48]
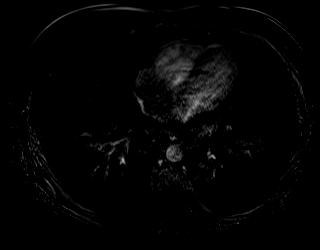

[42 of 48 positions shown; findings below may reference images not displayed]

FINDINGS: Portions of exam are mildly motion degraded.

Lower chest: Normal heart size without pericardial or pleural
effusion.

Hepatobiliary: Normal liver. Normal gallbladder, without biliary
ductal dilatation.

Pancreas:  Normal, without mass or ductal dilatation.

Spleen:  Normal in size, without focal abnormality.

Adrenals/Urinary Tract: Normal adrenal glands. Normal left kidney.
Mild scarring involves the upper pole right kidney. The CT
abnormality corresponds to a "pseudotumor" secondary to retained
cortex superimposed upon scarring. This is isointense to other
portions of the kidney on all pulse sequences. Example image [DATE].

Immediately anterior to this is an approximately 10 mm focus of
upper pole right renal precontrast T1 hyperintensity on image
25/series 11. No post-contrast enhancement, including on subtracted
series.

Stomach/Bowel: Normal stomach and abdominal bowel loops.

Vascular/Lymphatic: Normal caliber of the aorta and branch vessels.
No retroperitoneal or retrocrural adenopathy.

Other:  No ascites.

Musculoskeletal: No acute osseous abnormality.
IMPRESSION: 1. Mild motion degradation.
2. The CT abnormality represents a "pseudotumor" secondary to
retained upper pole right renal cortex, superimposed upon scarring.
3. Hemorrhagic/proteinaceous cyst of 10 mm within the anterior upper
pole right kidney.
4.  Aortic Atherosclerosis ([id number]).

## 2019-06-13 ENCOUNTER — Other Ambulatory Visit: Payer: Self-pay | Admitting: Nurse Practitioner

## 2019-07-04 ENCOUNTER — Ambulatory Visit: Payer: 59 | Admitting: Family Medicine

## 2019-07-10 ENCOUNTER — Other Ambulatory Visit: Payer: Self-pay | Admitting: Family Medicine

## 2019-07-10 DIAGNOSIS — E785 Hyperlipidemia, unspecified: Secondary | ICD-10-CM

## 2019-07-10 DIAGNOSIS — E786 Lipoprotein deficiency: Secondary | ICD-10-CM

## 2019-07-11 ENCOUNTER — Ambulatory Visit (INDEPENDENT_AMBULATORY_CARE_PROVIDER_SITE_OTHER): Payer: 59 | Admitting: Family Medicine

## 2019-07-11 ENCOUNTER — Encounter: Payer: Self-pay | Admitting: Family Medicine

## 2019-07-11 ENCOUNTER — Other Ambulatory Visit: Payer: Self-pay

## 2019-07-11 VITALS — BP 124/76 | HR 88 | Temp 98.0°F | Resp 16 | Ht 69.0 in | Wt 187.5 lb

## 2019-07-11 DIAGNOSIS — M4802 Spinal stenosis, cervical region: Secondary | ICD-10-CM

## 2019-07-11 DIAGNOSIS — M9981 Other biomechanical lesions of cervical region: Secondary | ICD-10-CM

## 2019-07-11 DIAGNOSIS — E782 Mixed hyperlipidemia: Secondary | ICD-10-CM

## 2019-07-11 DIAGNOSIS — J438 Other emphysema: Secondary | ICD-10-CM

## 2019-07-11 DIAGNOSIS — E663 Overweight: Secondary | ICD-10-CM

## 2019-07-11 DIAGNOSIS — E786 Lipoprotein deficiency: Secondary | ICD-10-CM

## 2019-07-11 DIAGNOSIS — R7303 Prediabetes: Secondary | ICD-10-CM

## 2019-07-11 DIAGNOSIS — Z716 Tobacco abuse counseling: Secondary | ICD-10-CM

## 2019-07-11 DIAGNOSIS — I10 Essential (primary) hypertension: Secondary | ICD-10-CM

## 2019-07-11 DIAGNOSIS — N281 Cyst of kidney, acquired: Secondary | ICD-10-CM

## 2019-07-11 DIAGNOSIS — M5030 Other cervical disc degeneration, unspecified cervical region: Secondary | ICD-10-CM

## 2019-07-11 NOTE — Progress Notes (Signed)
Name: Travis Watson   MRN: [id number]    DOB: 02-Sep-1956   Date:07/11/2019       Progress Note  Subjective  Chief Complaint  Chief Complaint  Patient presents with  . Follow-up    6 month recheck  . Hypertension  . Hyperlipidemia    HPI  Hyperlipidemia: Current Medication Regimen: Atorvastatin 40mg . Not taking ASA - does not want to take ASA at this time. Last Lipids: Lab Results  Component Value Date   CHOL 133 12/30/2018   HDL 27 (L) 12/30/2018   LDLCALC 87 12/30/2018   TRIG 95 12/30/2018   CHOLHDL 4.9 12/30/2018  The 10-year ASCVD risk score Mikey Bussing DC Jr., et al., 2013) is: 17.8%   Values used to calculate the score:     Age: 17 years     Sex: Male     Is Non-Hispanic African American: No     Diabetic: No     Tobacco smoker: Yes     Systolic Blood Pressure: 270 mmHg     Is BP treated: Yes     HDL Cholesterol: 27 mg/dL     Total Cholesterol: 133 mg/dL - Current Diet: Balanced, trying to eat healthy fats - Denies: Chest pain, shortness of breath, myalgias. - Documented aortic atherosclerosis? No - Risk factors for atherosclerosis: diabetes mellitus, hypercholesterolemia, hypertension and smoking  HTN:  -does take medications as prescribed - current regimen includes losartan 25mg .  - taking medications as instructed, no medication side effects noted, no TIAs, no chest pain on exertion, no dyspnea on exertion, no swelling of ankles, no orthostatic dizziness or lightheadedness, no palpitations - DASH diet discussed - pt does follow a low sodium diet  Obesity/Prediabetes: Denies polydipsia, polyphagia, or polyuria.  Diet is balanced, is exercising - yard work; used to go to the gym  Renal cyst: MRI in 2019 showed "pseudotumor" on upper right pole/renal cortex and hemorrhagic/proteinaceous cyst on the anterior upper right kidney pole.  DDD/Neural Foraminal stenosis of C-spine: States has not had any issues in about 2 years; doing well, no concerns  Tobacco  Abuse/Emphysema: He is having annual lung screening CT. He is down from 1ppd to 5 cigarettes per day.  HE is congratulated on this progress.  No wheezing or shortness of breath.  Patient Active Problem List   Diagnosis Date Noted  . Hx of colonic polyps 12/30/2018  . Renal cyst 10/19/2018  . Renal lesion 08/13/2018  . Paraseptal emphysema ([place]) 08/13/2018  . Low HDL (under 40) 06/11/2018  . Prediabetes 05/29/2018  . Medication monitoring encounter 05/29/2018  . Degenerative disc disease, cervical 11/25/2016  . 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 Acute neck pain 11/18/2016  . Hyperglycemia 12/25/2015  . Tobacco abuse counseling 12/25/2015  . Hypertension 08/23/2015  . Hyperlipidemia 08/23/2015    No past surgical history on file.  Family History  Problem Relation Age of Onset  . Diabetes Mother   . Emphysema Mother   . Hyperlipidemia Father   . Cancer Father        prostate  . Heart disease Maternal Grandmother   . Hypertension Maternal Grandmother   . Heart disease Maternal Grandfather   . Hypertension Maternal Grandfather   . Migraines Paternal Grandmother     Social History   Socioeconomic History  . Marital status: Single    Spouse name: Not on file  . Number of children: Not on file  . Years of education: Not on file  .  Highest education level: Not on file  Occupational History  . Not on file  Social Needs  . Financial resource strain: Not on file  . Food insecurity    Worry: Not on file    Inability: Not on file  . Transportation needs    Medical: Not on file    Non-medical: Not on file  Tobacco Use  . Smoking status: Current Every Day Smoker    Packs/day: 1.00    Years: 49.00    Pack years: 49.00    Types: Cigarettes  . Smokeless tobacco: Former NeurosurgeonUser    Types: Snuff  . Tobacco comment: quit smokeless tabacco about 40 years ago  Substance and Sexual Activity  . Alcohol use: Yes    Alcohol/week: 0.0 standard drinks     Comment: occasional  . Drug use: No  . Sexual activity: Not Currently  Lifestyle  . Physical activity    Days per week: Not on file    Minutes per session: Not on file  . Stress: Not on file  Relationships  . Social Musicianconnections    Talks on phone: Not on file    Gets together: Not on file    Attends religious service: Not on file    Active member of club or organization: Not on file    Attends meetings of clubs or organizations: Not on file    Relationship status: Not on file  . Intimate partner violence    Fear of current or ex partner: Not on file    Emotionally abused: Not on file    Physically abused: Not on file    Forced sexual activity: Not on file  Other Topics Concern  . Not on file  Social History Narrative  . Not on file     Current Outpatient Medications:  .  aspirin EC 81 MG tablet, Take 1 tablet (81 mg total) by mouth daily., Disp: , Rfl:  .  atorvastatin (LIPITOR) 40 MG tablet, TAKE 1 TABLET BY MOUTH EVERYDAY AT BEDTIME, Disp: 90 tablet, Rfl: 3 .  clobetasol cream (TEMOVATE) 0.05 %, APPLY TO AFFECTED AREA TWICE A DAY AS NEEDED -NOT TO FACE,GROIN OR UNDERARMS, Disp: , Rfl: 2 .  fluocinonide cream (LIDEX) 0.05 %, APPLY ON THE SKIN TWICE A DAY AS NEEDED, Disp: , Rfl: 6 .  losartan (COZAAR) 25 MG tablet, TAKE 2 TABLETS (50 MG TOTAL) BY MOUTH DAILY. (NOTE NEW STRENGTH AND INSTRUCTIONS), Disp: 180 tablet, Rfl: 0  No Known Allergies  I personally reviewed active problem list, medication list, allergies, notes from last encounter, lab results with the patient/caregiver today.   ROS  Constitutional: Negative for fever or weight change.  Respiratory: Negative for cough and shortness of breath.   Cardiovascular: Negative for chest pain or palpitations.  Gastrointestinal: Negative for abdominal pain, no bowel changes.  Musculoskeletal: Negative for gait problem or joint swelling.  Skin: Negative for rash.  Neurological: Negative for dizziness or headache.  No other  specific complaints in a complete review of systems (except as listed in HPI above).  Objective  Vitals:   07/11/19 0711  BP: 124/76  Pulse: 88  Resp: 16  Temp: 98 F (36.7 C)  TempSrc: Oral  SpO2: 93%  Weight: 187 lb 8 oz (85 kg)  Height: 5\' 9"  (1.753 m)   Body mass index is 27.69 kg/m.  Physical Exam  Constitutional: Patient appears well-developed and well-nourished. No distress.  HENT: Head: Normocephalic and atraumatic.  Eyes: Conjunctivae and EOM are normal. No  scleral icterus. Neck: Normal range of motion. Neck supple. No JVD present. No thyromegaly present.  Cardiovascular: Normal rate, regular rhythm and normal heart sounds.  No murmur heard. No BLE edema. Pulmonary/Chest: Effort normal and breath sounds normal. No respiratory distress. Musculoskeletal: Normal range of motion, no joint effusions. No gross deformities Neurological: Pt is alert and oriented to person, place, and time. No cranial nerve deficit. Coordination, balance, strength, speech and gait are normal.  Skin: Skin is warm and dry. No rash noted. No erythema.  Psychiatric: Patient has a normal mood and affect. behavior is normal. Judgment and thought content normal.  No results found for this or any previous visit (from the past 72 hour(s)).  PHQ2/9: Depression screen [facility] 2[facility] 07/11/2019 12/30/2018 06/29/2018 12/29/2017 12/29/2017  Decreased Interest 0 0 0 0 0  Down, Depressed, Hopeless 0 0 0 0 0  PHQ - 2 Score 0 0 0 0 0  Altered sleeping 0 0 0 - -  Tired, decreased energy 0 0 0 - -  Change in appetite 0 0 0 - -  Feeling bad or failure about yourself  0 0 0 - -  Trouble concentrating 0 0 0 - -  Moving slowly or fidgety/restless 0 0 0 - -  Suicidal thoughts 0 0 0 - -  PHQ-9 Score 0 0 0 - -  Difficult doing work/chores Not difficult at all - Not difficult at all - -   PHQ-2/9 Result is negative.    Fall Risk: Fall Risk  07/11/2019 12/30/2018 12/29/2017 07/22/2017 01/22/2017  Falls in the past year? 0 0 No No No   Number falls in past yr: 0 - - - -  Injury with Fall? 0 - - - -  Follow up Falls evaluation completed - - - -   Assessment & Plan  1. Low HDL (under 40) - Lipid panel  2. Essential hypertension - COMPLETE METABOLIC PANEL WITH GFR  3. Mixed hyperlipidemia - Lipid panel  4. Paraseptal emphysema (HCC) - Doing well, due in August for repeat CT  5. Degenerative disc disease, cervical - Stable  6. Neural foraminal stenosis of cervical spine - Stable  7. Renal cyst - No further action to be taken at this time; doing well, will check kidney function today  8. Prediabetes - Hemoglobin A1c  9. Tobacco abuse counseling - Decreasing cigarette use - congratulated on this.  10. Overweight (BMI 25.0-29.9) - Discussed importance of 150 minutes of physical activity weekly, eat two servings of fish weekly, eat one serving of tree nuts ( cashews, pistachios, pecans, almonds.Marland Kitchen.) every other day, eat 6 servings of fruit/vegetables daily and drink plenty of water and avoid sweet beverages.  - COMPLETE METABOLIC PANEL WITH GFR - Hemoglobin A1c

## 2019-08-25 ENCOUNTER — Telehealth: Payer: Self-pay | Admitting: *Deleted

## 2019-08-25 NOTE — Telephone Encounter (Signed)
Left message for patient to notify them that it is time to schedule annual low dose lung cancer screening CT scan. Instructed patient to call back to verify information prior to the scan being scheduled.  

## 2019-09-06 ENCOUNTER — Other Ambulatory Visit: Payer: Self-pay

## 2019-09-08 ENCOUNTER — Other Ambulatory Visit: Payer: Self-pay

## 2019-09-08 DIAGNOSIS — I10 Essential (primary) hypertension: Secondary | ICD-10-CM

## 2019-09-09 MED ORDER — LOSARTAN POTASSIUM 50 MG PO TABS: 50.0000 mg | ORAL_TABLET | Freq: Every day | ORAL | 1 refills | Status: DC

## 2019-09-23 ENCOUNTER — Encounter: Payer: Self-pay | Admitting: *Deleted

## 2019-11-22 ENCOUNTER — Telehealth: Payer: Self-pay

## 2019-11-22 NOTE — Telephone Encounter (Signed)
Left message for patient to notify them that it is time to schedule annual low dose lung cancer screening CT scan. Instructed patient to call back ([phone]) to verify information prior to the scan being scheduled.  

## 2019-11-29 ENCOUNTER — Encounter: Payer: Self-pay | Admitting: *Deleted

## 2019-12-09 ENCOUNTER — Telehealth: Payer: Self-pay | Admitting: *Deleted

## 2019-12-09 DIAGNOSIS — Z87891 Personal history of nicotine dependence: Secondary | ICD-10-CM

## 2019-12-09 NOTE — Telephone Encounter (Signed)
Patient has been notified that annual lung cancer screening low dose CT scan is due currently or will be in near future. Confirmed that patient is within the age range of 55-77, and asymptomatic, (no signs or symptoms of lung cancer). Patient denies illness that would prevent curative treatment for lung cancer if found. Verified smoking history, (current, 49.25 pack year). The shared decision making visit was done 08/13/18. Patient is agreeable for CT scan being scheduled.

## 2019-12-12 ENCOUNTER — Encounter: Payer: Self-pay | Admitting: *Deleted

## 2019-12-29 ENCOUNTER — Ambulatory Visit
Admission: RE | Admit: 2019-12-29 | Discharge: 2019-12-29 | Disposition: A | Discharge status: Home / Self Care | Payer: 59 | Source: Ambulatory Visit | Attending: Nurse Practitioner | Admitting: Nurse Practitioner

## 2019-12-29 ENCOUNTER — Other Ambulatory Visit: Payer: Self-pay

## 2019-12-29 DIAGNOSIS — Z87891 Personal history of nicotine dependence: Secondary | ICD-10-CM | POA: Diagnosis present

## 2020-01-02 ENCOUNTER — Other Ambulatory Visit: Payer: Self-pay | Admitting: Family Medicine

## 2020-01-02 ENCOUNTER — Encounter: Payer: Self-pay | Admitting: *Deleted

## 2020-01-02 DIAGNOSIS — I10 Essential (primary) hypertension: Secondary | ICD-10-CM

## 2020-01-12 ENCOUNTER — Other Ambulatory Visit: Payer: Self-pay

## 2020-01-12 ENCOUNTER — Ambulatory Visit (INDEPENDENT_AMBULATORY_CARE_PROVIDER_SITE_OTHER): Payer: 59 | Admitting: Family Medicine

## 2020-01-12 ENCOUNTER — Encounter: Payer: Self-pay | Admitting: Family Medicine

## 2020-01-12 VITALS — BP 118/72 | HR 88 | Temp 98.4°F | Resp 16 | Ht 69.0 in | Wt 188.9 lb

## 2020-01-12 DIAGNOSIS — E782 Mixed hyperlipidemia: Secondary | ICD-10-CM | POA: Diagnosis not present

## 2020-01-12 DIAGNOSIS — I10 Essential (primary) hypertension: Secondary | ICD-10-CM

## 2020-01-12 DIAGNOSIS — R7303 Prediabetes: Secondary | ICD-10-CM

## 2020-01-12 DIAGNOSIS — E786 Lipoprotein deficiency: Secondary | ICD-10-CM

## 2020-01-12 DIAGNOSIS — Z716 Tobacco abuse counseling: Secondary | ICD-10-CM | POA: Diagnosis not present

## 2020-01-12 DIAGNOSIS — J438 Other emphysema: Secondary | ICD-10-CM

## 2020-01-12 MED ORDER — LOSARTAN POTASSIUM 25 MG PO TABS: 25.0000 mg | ORAL_TABLET | Freq: Every day | ORAL | 1 refills | Status: DC

## 2020-01-12 NOTE — Progress Notes (Signed)
Name: Travis Watson   MRN: [id number]    DOB: 10-Sep-1956   Date:01/12/2020       Progress Note  Subjective  Chief Complaint  Chief Complaint  Patient presents with  . Hypertension    6 month follow up  . Hyperlipidemia    HPI  Hyperlipidemia: Current Medication Regimen: Atorvastatin 40mg . Not taking ASA.  No changes, stable.  No myalgias or chest pain.  HTN:  -does take medications as prescribed - current regimen includes losartan 50mg .  - taking medications as instructed, no medication side effects noted, no TIAs, no chest pain on exertion, no dyspnea on exertion, no swelling of ankles, no palpitations.  He does have orthostatic lightheadedness about once every 3 days or so - we will lower dose to see how he does. - DASH diet discussed - pt does follow a low sodium diet  Obesity/Prediabetes:  Denies polydipsia, polyphagia, or polyuria.  Diet is balanced, is exercising - yard work; used to go to , but hasn't been going due to COVID-19 pandemic.   Tobacco Abuse/Emphysema:  He is having annual lung screening CT - most recent was 12/29/19 and was told to follow up annually. He is down from 1ppd to 5 cigarettes per day (about the same as last visit).  He is congratulated on this progress.  No wheezing or shortness of breath.   Patient Active Problem List   Diagnosis Date Noted  . Overweight (BMI 25.0-29.9) 07/11/2019  . Hx of colonic polyps 12/30/2018  . Renal cyst 10/19/2018  . 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 Low HDL (under 40) 06/11/2018  . Prediabetes 05/29/2018  . Degenerative disc disease, cervical 11/25/2016  . 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 Tobacco abuse counseling 12/25/2015  . Hypertension 08/23/2015  . Hyperlipidemia 08/23/2015    No past surgical history on file.  Family History  Problem Relation Age of Onset  . Diabetes Mother   . Emphysema Mother   . Hyperlipidemia Father   . Cancer Father    prostate  . Heart disease Maternal Grandmother   . Hypertension Maternal Grandmother   . Heart disease Maternal Grandfather   . Hypertension Maternal Grandfather   . Migraines Paternal Grandmother     Social History   Socioeconomic History  . Marital status: Single    Spouse name: Not on file  . Number of children: Not on file  . Years of education: Not on file  . Highest education level: Not on file  Occupational History  . Not on file  Tobacco Use  . Smoking status: Current Every Day Smoker    Packs/day: 1.00    Years: 49.00    Pack years: 49.00    Types: Cigarettes  . Smokeless tobacco: Former 10/23/2015    Types: Snuff  . Tobacco comment: quit smokeless tabacco about 40 years ago  Substance and Sexual Activity  . Alcohol use: Yes    Alcohol/week: 0.0 standard drinks    Comment: occasional  . Drug use: No  . Sexual activity: Not Currently  Other Topics Concern  . Not on file  Social History Narrative  . Not on file   Social Determinants of Health   Financial Resource Strain:   . Difficulty of Paying Living Expenses: Not on file  Food Insecurity:   . Worried About 10/23/2015 in the Last Year: Not on file  . Ran Out of Food in the Last Year: Not on file  Transportation Needs:   . Freight forwarder (Medical): Not on file  . Lack of Transportation (Non-Medical): Not on file  Physical Activity:   . Days of Exercise per Week: Not on file  . Minutes of Exercise per Session: Not on file  Stress:   . Feeling of Stress : Not on file  Social Connections:   . Frequency of Communication with Friends and Family: Not on file  . Frequency of Social Gatherings with Friends and Family: Not on file  . Attends Religious Services: Not on file  . Active Member of Clubs or Organizations: Not on file  . Attends Banker Meetings: Not on file  . Marital Status: Not on file  Intimate Partner Violence:   . Fear of Current or Ex-Partner: Not on file  .  Emotionally Abused: Not on file  . Physically Abused: Not on file  . Sexually Abused: Not on file     Current Outpatient Medications:  .  atorvastatin (LIPITOR) 40 MG tablet, TAKE 1 TABLET BY MOUTH EVERYDAY AT BEDTIME, Disp: 90 tablet, Rfl: 3 .  losartan (COZAAR) 50 MG tablet, TAKE 1 TABLET (50 MG TOTAL) BY MOUTH DAILY. (NOTE NEW STRENGTH AND INSTRUCTIONS), Disp: 90 tablet, Rfl: 1 .  clobetasol cream (TEMOVATE) 0.05 %, APPLY TO AFFECTED AREA TWICE A DAY AS NEEDED -NOT TO FACE,GROIN OR UNDERARMS, Disp: , Rfl: 2 .  fluocinonide cream (LIDEX) 0.05 %, APPLY ON THE SKIN TWICE A DAY AS NEEDED, Disp: , Rfl: 6  No Known Allergies  I personally reviewed active problem list, medication list, allergies, health maintenance, notes from last encounter, lab results with the patient/caregiver today.   ROS  Constitutional: Negative for fever or weight change.  Respiratory: Negative for cough and shortness of breath.   Cardiovascular: Negative for chest pain or palpitations.  Gastrointestinal: Negative for abdominal pain, no bowel changes.  Musculoskeletal: Negative for gait problem or joint swelling.  Skin: Negative for rash.  Neurological: Negative for dizziness or headache.  No other specific complaints in a complete review of systems (except as listed in HPI above).  Objective  Vitals:   01/12/20 0716  BP: 118/72  Pulse: 88  Resp: 16  Temp: 98.4 F (36.9 C)  TempSrc: Temporal  SpO2: 99%  Weight: 188 lb 14.4 oz (85.7 kg)  Height: 5\' 9"  (1.753 m)   Constitutional: Patient appears well-developed and well-nourished. No distress.    Body mass index is 27.9 kg/m.  Physical Exam HENT: Head: Normocephalic and atraumatic.  Eyes: Conjunctivae and EOM are normal. No scleral icterus.  Neck: Normal range of motion. Neck supple. No JVD present.  Cardiovascular: Normal rate, regular rhythm and normal heart sounds.  No murmur heard. No BLE edema. Pulmonary/Chest: Effort normal and breath  sounds normal. No respiratory distress. Musculoskeletal: Normal range of motion, no joint effusions. No gross deformities Neurological: Pt is alert and oriented to person, place, and time. No cranial nerve deficit. Coordination, balance, strength, speech and gait are normal.  Skin: Skin is warm and dry. No rash noted. No erythema.  Psychiatric: Patient has a normal mood and affect. behavior is normal. Judgment and thought content normal.  No results found for this or any previous visit (from the past 72 hour(s)).  PHQ2/9: Depression screen [facility] 2/9 01/12/2020 07/11/2019 12/30/2018 06/29/2018 12/29/2017  Decreased Interest 0 0 0 0 0  Down, Depressed, Hopeless 0 0 0 0 0  PHQ - 2 Score 0 0 0 0 0  Altered sleeping 0 0  0 0 -  Tired, decreased energy 0 0 0 0 -  Change in appetite 0 0 0 0 -  Feeling bad or failure about yourself  0 0 0 0 -  Trouble concentrating 0 0 0 0 -  Moving slowly or fidgety/restless 0 0 0 0 -  Suicidal thoughts 0 0 0 0 -  PHQ-9 Score 0 0 0 0 -  Difficult doing work/chores Not difficult at all Not difficult at all - Not difficult at all -   PHQ-2/9 Result is negative.    Fall Risk: Fall Risk  01/12/2020 07/11/2019 12/30/2018 12/29/2017 07/22/2017  Falls in the past year? 0 0 0 No No  Number falls in past yr: 0 0 - - -  Injury with Fall? 0 0 - - -  Follow up Falls evaluation completed Falls evaluation completed - - -   Assessment & Plan  1. Essential hypertension - 2 week BP check nurse visit to see how lower dose losartan is going. - CMP per orders - losartan (COZAAR) 25 MG tablet; Take 1 tablet (25 mg total) by mouth daily.  Dispense: 90 tablet; Refill: 1 - DASH diet  2. Mixed hyperlipidemia - Continue statin therapy; lipids per orders. - Discussed low fat diet  3. Tobacco abuse counseling - Work on cessation; he is down to 5 cigarettes a da  4. Prediabetes - A1C per orders - CMP per orders - Discussed with the patient the risk posed by an increased BMI. Discussed  importance of portion control, calorie counting and at least 150 minutes of physical activity weekly. Avoid sweet beverages and drink more water. Eat at least 6 servings of fruit and vegetables daily   5. Low HDL (under 40) - Lipids per orders  6. Paraseptal emphysema ([place]) - Annual lung cancer screenings; doing well.

## 2020-01-13 LAB — COMPLETE METABOLIC PANEL WITH GFR
AG Ratio: 1.5 (calc) (ref 1.0–2.5)
ALT: 24 U/L (ref 9–46)
AST: 18 U/L (ref 10–35)
Albumin: 4.2 g/dL (ref 3.6–5.1)
Alkaline phosphatase (APISO): 59 U/L (ref 35–144)
BUN: 13 mg/dL (ref 7–25)
CO2: 27 mmol/L (ref 20–32)
Calcium: 9.7 mg/dL (ref 8.6–10.3)
Chloride: 106 mmol/L (ref 98–110)
Creat: 0.91 mg/dL (ref 0.70–1.25)
GFR, Est African American: 104 mL/min/{1.73_m2} (ref >=60)
GFR, Est Non African American: 90 mL/min/{1.73_m2} (ref >=60)
Globulin: 2.8 g/dL (calc) (ref 1.9–3.7)
Glucose, Bld: 99 mg/dL (ref 65–99)
Potassium: 4.7 mmol/L (ref 3.5–5.3)
Sodium: 139 mmol/L (ref 135–146)
Total Bilirubin: 0.6 mg/dL (ref 0.2–1.2)
Total Protein: 7 g/dL (ref 6.1–8.1)

## 2020-01-13 LAB — HEMOGLOBIN A1C
Hgb A1c MFr Bld: 5.9 % of total Hgb — ABNORMAL HIGH (ref <5.7)
Mean Plasma Glucose: 123 (calc)
eAG (mmol/L): 6.8 (calc)

## 2020-01-13 LAB — LIPID PANEL
Cholesterol: 117 mg/dL (ref <200)
HDL: 26 mg/dL — ABNORMAL LOW (ref >=40)
LDL Cholesterol (Calc): 72 mg/dL (calc)
Non-HDL Cholesterol (Calc): 91 mg/dL (calc) (ref <130)
Total CHOL/HDL Ratio: 4.5 (calc) (ref <5.0)
Triglycerides: 104 mg/dL (ref <150)

## 2020-01-26 ENCOUNTER — Other Ambulatory Visit: Payer: Self-pay

## 2020-01-26 ENCOUNTER — Ambulatory Visit: Payer: 59 | Admitting: Emergency Medicine

## 2020-01-26 VITALS — BP 122/72 | HR 97 | Temp 97.8°F | Resp 14

## 2020-01-26 DIAGNOSIS — I10 Essential (primary) hypertension: Secondary | ICD-10-CM

## 2020-01-26 NOTE — Progress Notes (Signed)
Patient came in for BP check and stated he is still having mild dizziness but no other symptoms

## 2020-06-14 ENCOUNTER — Telehealth: Payer: Self-pay

## 2020-06-14 NOTE — Telephone Encounter (Signed)
Pt will need appt for refills ° °

## 2020-06-14 NOTE — Telephone Encounter (Signed)
Pt has an appt on 07/19/2020 does he need to come in before then

## 2020-06-19 ENCOUNTER — Other Ambulatory Visit: Payer: Self-pay | Admitting: Family Medicine

## 2020-06-19 DIAGNOSIS — E785 Hyperlipidemia, unspecified: Secondary | ICD-10-CM

## 2020-06-19 NOTE — Telephone Encounter (Signed)
Medication Refill - Medication: atorvastatin (LIPITOR) 40 MG tablet    Has the patient contacted their pharmacy? Yes.   (Agent: If no, request that the patient contact the pharmacy for the refill.) (Agent: If yes, when and what did the pharmacy advise?)  Preferred Pharmacy (with phone number or street name):  [facility] [address], [place] - [address]  [address] [place] [address]  Phone: [phone] Fax: [phone]     Agent: Please be advised that RX refills may take up to 3 business days. We ask that you follow-up with your pharmacy.

## 2020-06-19 NOTE — Telephone Encounter (Signed)
Requested medication (s) are due for refill today: yes  Requested medication (s) are on the active medication list: yes  Last refill:  07/11/19  #90 3 RF  Future visit scheduled: yes  Notes to clinic: pt is between providers. Has upcoming appt with Danelle Berry PA   Requested Prescriptions  Pending Prescriptions Disp Refills   atorvastatin (LIPITOR) 40 MG tablet 90 tablet 3      Cardiovascular:  Antilipid - Statins Failed - 06/19/2020  4:02 PM      Failed - HDL in normal range and within 360 days    HDL  Date Value Ref Range Status  01/12/2020 26 (L) > OR = 40 mg/dL Final  08/04/4817 31 (L) >39 mg/dL Final          Passed - Total Cholesterol in normal range and within 360 days    Cholesterol, Total  Date Value Ref Range Status  06/25/2016 153 100 - 199 mg/dL Final   Cholesterol  Date Value Ref Range Status  01/12/2020 117 <200 mg/dL Final          Passed - LDL in normal range and within 360 days    LDL Cholesterol (Calc)  Date Value Ref Range Status  01/12/2020 72 mg/dL (calc) Final    Comment:    Reference range: <100 . Desirable range <100 mg/dL for primary prevention;   <70 mg/dL for patients with CHD or diabetic patients  with > or = 2 CHD risk factors. Marland Kitchen LDL-C is now calculated using the Martin-Hopkins  calculation, which is a validated novel method providing  better accuracy than the Friedewald equation in the  estimation of LDL-C.  Horald Pollen et al. Lenox Ahr. 5631;497(02): 2061-2068  (http://education.[facility].com/faq/FAQ164)           Passed - Triglycerides in normal range and within 360 days    Triglycerides  Date Value Ref Range Status  01/12/2020 104 <150 mg/dL Final          Passed - Patient is not pregnant      Passed - Valid encounter within last 12 months    Recent Outpatient Visits           5 months ago Essential hypertension   [facility] Doren Custard, FNP   11 months ago Low HDL (under 40)   [facility] Doren Custard, FNP   1 year ago Essential hypertension   [facility] Lada, Janit Bern, MD   1 year ago Hyperlipidemia with low HDL   [facility] Lada, Janit Bern, MD   2 years ago Essential hypertension   [facility] Ellyn Hack, MD       Future Appointments             In 1 month Danelle Berry, PA-C [facility], [facility]

## 2020-06-20 MED ORDER — ATORVASTATIN CALCIUM 40 MG PO TABS: ORAL_TABLET | ORAL | 3 refills | Status: DC

## 2020-06-27 ENCOUNTER — Other Ambulatory Visit: Payer: Self-pay

## 2020-06-27 DIAGNOSIS — I10 Essential (primary) hypertension: Secondary | ICD-10-CM

## 2020-06-27 MED ORDER — LOSARTAN POTASSIUM 25 MG PO TABS: 25.0000 mg | ORAL_TABLET | Freq: Every day | ORAL | 0 refills | Status: DC

## 2020-06-29 ENCOUNTER — Other Ambulatory Visit: Payer: Self-pay | Admitting: Family Medicine

## 2020-06-29 DIAGNOSIS — I10 Essential (primary) hypertension: Secondary | ICD-10-CM

## 2020-06-29 MED ORDER — LOSARTAN POTASSIUM 25 MG PO TABS: 25.0000 mg | ORAL_TABLET | Freq: Every day | ORAL | 3 refills | Status: DC

## 2020-07-16 ENCOUNTER — Encounter: Payer: 59 | Admitting: Family Medicine

## 2020-07-19 ENCOUNTER — Encounter: Payer: 59 | Admitting: Family Medicine

## 2020-10-01 NOTE — Progress Notes (Signed)
Patient: Travis Watson, Male    DOB: [birth year], 64 y.o.   MRN: [id number] Danelle Berry, PA-C Visit Date: 10/02/2020  Today's Provider: Danelle Berry, PA-C   Chief Complaint  Patient presents with  . Annual Exam   Subjective:   Annual physical exam:  Travis Watson is a 64 y.o. male who presents today for health maintenance and annual & complete physical exam.   Exercise/Activity:  2 weeks   Diet/nutrition:  Watches what he eats  Sleep:  Sleeps fine, 6-7 hours a day   Hypertension:  Currently managed on losartan 25 mg daily Pt reports good med compliance and denies any SE. he states the dose was recently decreased by his prior PCP due to some lightheaded and dizzy episodes when standing up he no longer has any of this symptoms Blood pressure today is well controlled. BP Readings from Last 3 Encounters:  10/02/20 128/72  01/26/20 122/72  01/12/20 118/72  Pt denies CP, SOB, exertional sx, LE edema, palpitation, Ha's, visual disturbances, lightheadedness, hypotension, syncope.  Hyperlipidemia: Currently treated with lipitor 40, pt reports good med compliance Last Lipids: Lab Results  Component Value Date   CHOL 117 01/12/2020   HDL 26 (L) 01/12/2020   LDLCALC 72 01/12/2020   TRIG 104 01/12/2020   CHOLHDL 4.5 01/12/2020   - Denies: Chest pain, shortness of breath, myalgias, claudication   He sees dermatology     USPSTF grade A and B recommendations - reviewed and addressed today  Depression:  Phq 9 completed today by patient, was reviewed by me with patient in the room, score is  negative, pt feels good PHQ 2/9 Scores 10/02/2020 01/12/2020 07/11/2019 12/30/2018  PHQ - 2 Score 0 0 0 0  PHQ- 9 Score - 0 0 0   Depression screen [facility] 2/9 10/02/2020 01/12/2020 07/11/2019 12/30/2018 06/29/2018  Decreased Interest 0 0 0 0 0  Down, Depressed, Hopeless 0 0 0 0 0  PHQ - 2 Score 0 0 0 0 0  Altered sleeping - 0 0 0 0  Tired, decreased energy - 0 0 0 0    Change in appetite - 0 0 0 0  Feeling bad or failure about yourself  - 0 0 0 0  Trouble concentrating - 0 0 0 0  Moving slowly or fidgety/restless - 0 0 0 0  Suicidal thoughts - 0 0 0 0  PHQ-9 Score - 0 0 0 0  Difficult doing work/chores - Not difficult at all Not difficult at all - Not difficult at all    Hep C Screening:  done 06/09/2018 STD testing and prevention  (HIV/chl/gon/syphilis): 06/09/18 Intimate partner violence:  Lives alone safe at home  Prostate cancer: last done 06/09/18, ordered Patient states he has no family history but also states that his father had prostate cancer that was treated and eradicated this was diagnosed for his father in his 27s, patient adamantly denies any symptoms Prostate cancer screening with PSA: Discussed risks and benefits of PSA testing and provided handout. Pt decided to not to have PSA drawn today.  Lab Results  Component Value Date   PSA 0.9 06/09/2018   Urinary Symptoms:  IPSS Questionnaire (AUA-7): Over the past month.   1)  How often have you had a sensation of not emptying your bladder completely after you finish urinating?  0 - Not at all  2)  How often have you had to urinate again less than two hours after you finished urinating?  0 - Not at all  3)  How often have you found you stopped and started again several times when you urinated?  0 - Not at all  4) How difficult have you found it to postpone urination?  0 - Not at all  5) How often have you had a weak urinary stream?  0 - Not at all  6) How often have you had to push or strain to begin urination?  0 - Not at all  7) How many times did you most typically get up to urinate from the time you went to bed until the time you got up in the morning?  0 - None  Total score:  0-7 mildly symptomatic   8-19 moderately symptomatic   20-35 severely symptomatic    Advanced Care Planning:  A voluntary discussion about advance care planning including the explanation and discussion of  advance directives.  Discussed health care proxy and Living will, and the patient was able to identify a health care proxy as his son.  Patient does have a living will at present time. If patient does have living will, I have requested they bring this to the clinic to be scanned in to their chart.  Health Maintenance  Topic Date Due  . COVID-19 Vaccine (1) Never done  . INFLUENZA VACCINE  Never done  . TETANUS/TDAP  01/11/2021 (Originally [birth year]31/1975)  . COLONOSCOPY  04/30/2023  . Hepatitis C Screening  Completed  . HIV Screening  Completed     Skin cancer:  Sees dermatology annually Pt reports no hx of skin cancer, suspicious lesions/biopsies in the past.  Colorectal cancer:  colonoscopy UTD  did a stool card recently that was negative -he will bring in the record that he has at home I cannot see it after reviewing chart and media tab He denies any change in his bowel movements, caliber of stool, melena, hematochezia, vague abdominal pain or unintentional weight loss   Lung cancer:   Low Dose CT Chest recommended if Age 48-80 years, 20 pack-year currently smoking OR have quit w/in 15years. Patient does qualify.   Is doing annual screenings, last reviewed Social History   Tobacco Use  . Smoking status: Current Every Day Smoker    Packs/day: 1.00    Years: 49.00    Pack years: 49.00    Types: Cigarettes  . Smokeless tobacco: Former NeurosurgeonUser    Types: Snuff  . Tobacco comment: quit smokeless tabacco about 40 years ago  Substance Use Topics  . Alcohol use: Yes    Alcohol/week: 0.0 standard drinks    Comment: occasional    Patient does desire to quit smoking says that he is working on it "in his own way" Discussed smoking cessation offered resources and medications to help. Smoking cessation instruction/counseling given:  counseled patient on the dangers of tobacco use, advised patient to stop smoking, and reviewed strategies to maximize success   Alcohol screening:   [facility]  AUDIT-C Score 1     AAA:  N/A The USPSTF recommends one-time screening with ultrasonography in men ages 2465 to 6275 years who have ever smoked  ECG:  N/A  Blood pressure/Hypertension: BP Readings from Last 3 Encounters:  10/02/20 128/72  01/26/20 122/72  01/12/20 118/72   Weight/Obesity: Wt Readings from Last 3 Encounters:  10/02/20 181 lb 11.2 oz (82.4 kg)  01/12/20 188 lb 14.4 oz (85.7 kg)  12/29/19 189 lb (85.7 kg)  BMI Readings from Last 3 Encounters:  10/02/20 26.83 kg/m  01/12/20 27.90 kg/m  12/29/19 27.91 kg/m    Lipids:  Lab Results  Component Value Date   CHOL 117 01/12/2020   CHOL 133 12/30/2018   CHOL 122 06/09/2018   Lab Results  Component Value Date   HDL 26 (L) 01/12/2020   HDL 27 (L) 12/30/2018   HDL 26 (L) 06/09/2018   Lab Results  Component Value Date   LDLCALC 72 01/12/2020   LDLCALC 87 12/30/2018   LDLCALC 77 06/09/2018   Lab Results  Component Value Date   TRIG 104 01/12/2020   TRIG 95 12/30/2018   TRIG 102 06/09/2018   Lab Results  Component Value Date   CHOLHDL 4.5 01/12/2020   CHOLHDL 4.9 12/30/2018   CHOLHDL 4.7 06/09/2018   No results found for: LDLDIRECT Based on the results of lipid panel his/her cardiovascular risk factor ( using [facility] )  in the next 10 years is : The ASCVD Risk score Denman George DC Jr., et al., 2013) failed to calculate for the following reasons:   The valid total cholesterol range is 130 to 320 mg/dL Glucose:  Glucose, Bld  Date Value Ref Range Status  01/12/2020 99 65 - 99 mg/dL Final    Comment:    .            Fasting reference interval .   12/30/2018 106 65 - 139 mg/dL Final    Comment:    .        Non-fasting reference interval .   06/09/2018 100 (H) 65 - 99 mg/dL Final    Comment:    .            Fasting reference interval . For someone without known diabetes, a glucose value between 100 and 125 mg/dL is consistent  with prediabetes and should be confirmed with a follow-up test. .     Social History      He  reports that he has been smoking cigarettes. He has a 49.00 pack-year smoking history. He has quit using smokeless tobacco.  His smokeless tobacco use included snuff. He reports current alcohol use. He reports that he does not use drugs.       Social History   Socioeconomic History  . Marital status: Single    Spouse name: Not on file  . Number of children: Not on file  . Years of education: Not on file  . Highest education level: Not on file  Occupational History  . Not on file  Tobacco Use  . Smoking status: Current Every Day Smoker    Packs/day: 1.00    Years: 49.00    Pack years: 49.00    Types: Cigarettes  . Smokeless tobacco: Former Neurosurgeon    Types: Snuff  . Tobacco comment: quit smokeless tabacco about 40 years ago  Vaping Use  . Vaping Use: Never used  Substance and Sexual Activity  . Alcohol use: Yes    Alcohol/week: 0.0 standard drinks    Comment: occasional  . Drug use: No  . Sexual activity: Not Currently  Other Topics Concern  . Not on file  Social History Narrative  . Not on file   Social Determinants of Health   Financial Resource Strain: Low Risk   . Difficulty of Paying Living Expenses: Not hard at all  Food Insecurity: Food Insecurity Present  . Worried About Programme researcher, broadcasting/film/video in the Last Year: Sometimes true  . Ran  Out of Food in the Last Year: Sometimes true  Transportation Needs: No Transportation Needs  . Lack of Transportation (Medical): No  . Lack of Transportation (Non-Medical): No  Physical Activity: Insufficiently Active  . Days of Exercise per Week: 2 days  . Minutes of Exercise per Session: 30 min  Stress: No Stress Concern Present  . Feeling of Stress : Not at all  Social Connections: Socially Isolated  . Frequency of Communication with Friends and Family: More than three times a week  . Frequency of Social Gatherings with Friends and  Family: Once a week  . Attends Religious Services: Never  . Active Member of Clubs or Organizations: No  . Attends Banker Meetings: Never  . Marital Status: Divorced     Family History        Family Status  Relation Name Status  . Mother  Deceased       diabetes and emphysema  . Father  Deceased       old age  . PGF  Deceased       heart attack   . Brother  Alive  . Son  Alive  . MGM  Deceased  . MGF  Deceased  . PGM  Deceased        His family history includes Cancer in his father; Diabetes in his mother; Emphysema in his mother; Heart disease in his maternal grandfather and maternal grandmother; Hyperlipidemia in his father; Hypertension in his maternal grandfather and maternal grandmother; Migraines in his paternal grandmother.       Family History  Problem Relation Age of Onset  . Diabetes Mother   . Emphysema Mother   . Hyperlipidemia Father   . Cancer Father        prostate  . Heart disease Maternal Grandmother   . Hypertension Maternal Grandmother   . Heart disease Maternal Grandfather   . Hypertension Maternal Grandfather   . Migraines Paternal Grandmother     Patient Active Problem List   Diagnosis Date Noted  . Overweight (BMI 25.0-29.9) 07/11/2019  . Hx of colonic polyps 12/30/2018  . Renal cyst 10/19/2018  . 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 Low HDL (under 40) 06/11/2018  . Prediabetes 05/29/2018  . Degenerative disc disease, cervical 11/25/2016  . 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 Tobacco abuse counseling 12/25/2015  . Hypertension 08/23/2015  . Hyperlipidemia 08/23/2015    No past surgical history on file.   Current Outpatient Medications:  .  atorvastatin (LIPITOR) 40 MG tablet, TAKE 1 TABLET BY MOUTH EVERYDAY AT BEDTIME, Disp: 90 tablet, Rfl: 3 .  clobetasol cream (TEMOVATE) 0.05 %, APPLY TO AFFECTED AREA TWICE A DAY AS NEEDED -NOT TO FACE,GROIN OR UNDERARMS, Disp: , Rfl: 2 .  fluocinonide cream  (LIDEX) 0.05 %, APPLY ON THE SKIN TWICE A DAY AS NEEDED, Disp: , Rfl: 6 .  losartan (COZAAR) 25 MG tablet, Take 1 tablet (25 mg total) by mouth daily., Disp: 90 tablet, Rfl: 3  No Known Allergies  Patient Care Team: Danelle Berry, PA-C as PCP - General (Family Medicine)   Chart Review: I personally reviewed active problem list, medication list, allergies, family history, social history, health maintenance, notes from last encounter, lab results, imaging with the patient/caregiver today.   Review of Systems  10 Systems reviewed and are negative for acute change except as noted in the HPI.       Objective:   Vitals:  Vitals:   10/02/20 1610  BP: 128/72  Pulse: 92  Resp: 18  Temp: 98.3 F (36.8 C)  TempSrc: Oral  SpO2: 100%  Weight: 181 lb 11.2 oz (82.4 kg)  Height: 5\' 9"  (1.753 m)    Body mass index is 26.83 kg/m.  Physical Exam Vitals and nursing note reviewed.  Constitutional:      General: He is not in acute distress.    Appearance: Normal appearance. He is well-developed. He is not ill-appearing, toxic-appearing or diaphoretic.     Interventions: Face mask in place.  HENT:     Head: Normocephalic and atraumatic.     Jaw: No trismus.     Right Ear: External ear normal.     Left Ear: External ear normal.  Eyes:     General: Lids are normal. No scleral icterus.       Right eye: No discharge.        Left eye: No discharge.     Conjunctiva/sclera: Conjunctivae normal.  Neck:     Trachea: Trachea and phonation normal. No tracheal deviation.  Cardiovascular:     Rate and Rhythm: Normal rate and regular rhythm.     Pulses: Normal pulses.          Radial pulses are 2+ on the right side and 2+ on the left side.       Posterior tibial pulses are 2+ on the right side and 2+ on the left side.     Heart sounds: Normal heart sounds. No murmur heard.  No friction rub. No gallop.   Pulmonary:     Effort: Pulmonary effort is normal. No respiratory distress.     Breath  sounds: Normal breath sounds. No stridor. No wheezing, rhonchi or rales.  Abdominal:     General: Bowel sounds are normal. There is no distension.     Palpations: Abdomen is soft.  Musculoskeletal:     Right lower leg: No edema.     Left lower leg: No edema.  Skin:    General: Skin is warm and dry.     Coloration: Skin is not jaundiced.     Findings: No rash.     Nails: There is no clubbing.  Neurological:     Mental Status: He is alert. Mental status is at baseline.     Cranial Nerves: No dysarthria or facial asymmetry.     Motor: No tremor or abnormal muscle tone.     Gait: Gait normal.  Psychiatric:        Mood and Affect: Mood normal.        Speech: Speech normal.        Behavior: Behavior normal. Behavior is cooperative.      No results found for this or any previous visit (from the past 2160 hour(s)).    Fall Risk: Fall Risk  10/02/2020 01/12/2020 07/11/2019 12/30/2018 12/29/2017  Falls in the past year? 0 0 0 0 No  Number falls in past yr: 0 0 0 - -  Injury with Fall? 0 0 0 - -  Follow up Falls evaluation completed Falls evaluation completed Falls evaluation completed - -    Functional Status Survey: Is the patient deaf or have difficulty hearing?: No Does the patient have difficulty seeing, even when wearing glasses/contacts?: No Does the patient have difficulty concentrating, remembering, or making decisions?: No Does the patient have difficulty walking or climbing stairs?: No Does the patient have difficulty dressing or bathing?: No Does the patient have difficulty doing errands alone such as visiting  a doctor's office or shopping?: No   Assessment & Plan:    CPE completed today  . Prostate cancer screening and PSA options (with potential risks and benefits of testing vs not testing) were discussed along with recent recs/guidelines, shared decision making and handout/information given to pt today  . USPSTF grade A and B recommendations reviewed with patient;  age-appropriate recommendations, preventive care, screening tests, etc discussed and encouraged; healthy living encouraged; see AVS for patient education given to patient  . Discussed importance of 150 minutes of physical activity weekly, AHA exercise recommendations given to pt in AVS/handout  . Discussed importance of healthy diet:  eating lean meats and proteins, avoiding trans fats and saturated fats, avoid simple sugars and excessive carbs in diet, eat 6 servings of fruit/vegetables daily and drink plenty of water and avoid sweet beverages.  DASH diet reviewed if pt has HTN  . Recommended pt to do annual eye exam and routine dental exams/cleanings  . Reviewed Health Maintenance: Health Maintenance  Topic Date Due  . COVID-19 Vaccine (1) Never done  . INFLUENZA VACCINE  Never done  . TETANUS/TDAP  01/11/2021 (Originally 03/31/1975)  . COLONOSCOPY  04/30/2023  . Hepatitis C Screening  Completed  . HIV Screening  Completed     ICD-10-CM   1. Annual physical exam  Z00.00 COMPLETE METABOLIC PANEL WITH GFR    CBC w/Diff/Platelet    Hemoglobin A1c    Lipid panel  2. Essential hypertension  I10 COMPLETE METABOLIC PANEL WITH GFR   stable, well controlled, BP at goal today, continue losartan 25 mg and DASH  3. Mixed hyperlipidemia  E78.2 COMPLETE METABOLIC PANEL WITH GFR    Lipid panel   compliant with statin, no myalgias, SE or concerns  4. Other emphysema (HCC)  J43.8    Patient denies any current symptoms he is a current smoker is working on quitting, doing lung cancer screening  5. Tobacco abuse counseling  Z71.6    smoking cessation/counseling reviewed today per chart  6. Prediabetes  R73.03 COMPLETE METABOLIC PANEL WITH GFR    Hemoglobin A1c   recheck A1C annually  7. Overweight (BMI 25.0-29.9)  E66.3 COMPLETE METABOLIC PANEL WITH GFR    Hemoglobin A1c    Lipid panel   continue healthy diet/lifestyle efforts  8. Vaccine counseling  Z71.85    Discussed Covid vaccination and  encouraged patient to consider receiving Covid vaccination      Danelle Berry, PA-C 10/02/20 8:29 AM  [facility]

## 2020-10-02 ENCOUNTER — Other Ambulatory Visit: Payer: Self-pay

## 2020-10-02 ENCOUNTER — Encounter: Payer: Self-pay | Admitting: Family Medicine

## 2020-10-02 ENCOUNTER — Ambulatory Visit (INDEPENDENT_AMBULATORY_CARE_PROVIDER_SITE_OTHER): Payer: 59 | Admitting: Family Medicine

## 2020-10-02 VITALS — BP 128/72 | HR 92 | Temp 98.3°F | Resp 18 | Ht 69.0 in | Wt 181.7 lb

## 2020-10-02 DIAGNOSIS — Z0000 Encounter for general adult medical examination without abnormal findings: Secondary | ICD-10-CM

## 2020-10-02 DIAGNOSIS — D72829 Elevated white blood cell count, unspecified: Secondary | ICD-10-CM

## 2020-10-02 DIAGNOSIS — Z716 Tobacco abuse counseling: Secondary | ICD-10-CM

## 2020-10-02 DIAGNOSIS — J438 Other emphysema: Secondary | ICD-10-CM

## 2020-10-02 DIAGNOSIS — Z125 Encounter for screening for malignant neoplasm of prostate: Secondary | ICD-10-CM

## 2020-10-02 DIAGNOSIS — E663 Overweight: Secondary | ICD-10-CM

## 2020-10-02 DIAGNOSIS — Z7185 Encounter for immunization safety counseling: Secondary | ICD-10-CM

## 2020-10-02 DIAGNOSIS — R7303 Prediabetes: Secondary | ICD-10-CM

## 2020-10-02 DIAGNOSIS — I10 Essential (primary) hypertension: Secondary | ICD-10-CM

## 2020-10-02 DIAGNOSIS — E782 Mixed hyperlipidemia: Secondary | ICD-10-CM | POA: Diagnosis not present

## 2020-10-02 LAB — CBC WITH DIFFERENTIAL/PLATELET
MCH: 33.2 pg — ABNORMAL HIGH (ref 27.0–33.0)
MCHC: 34.5 g/dL (ref 32.0–36.0)
MPV: 10.3 fL (ref 7.5–12.5)
Platelets: 279 10*3/uL (ref 140–400)
Total Lymphocyte: 15.9 %

## 2020-10-02 NOTE — Patient Instructions (Addendum)
Preventive Care 41-63 Years Old, Male Preventive care refers to lifestyle choices and visits with your health care provider that can promote health and wellness. This includes:  A yearly physical exam. This is also called an annual well check.  Regular dental and eye exams.  Immunizations.  Screening for certain conditions.  Healthy lifestyle choices, such as eating a healthy diet, getting regular exercise, not using drugs or products that contain nicotine and tobacco, and limiting alcohol use. What can I expect for my preventive care visit? Physical exam Your health care provider will check:  Height and weight. These may be used to calculate body mass index (BMI), which is a measurement that tells if you are at a healthy weight.  Heart rate and blood pressure.  Your skin for abnormal spots. Counseling Your health care provider may ask you questions about:  Alcohol, tobacco, and drug use.  Emotional well-being.  Home and relationship well-being.  Sexual activity.  Eating habits.  Work and work Statistician. What immunizations do I need?  Influenza (flu) vaccine  This is recommended every year. Tetanus, diphtheria, and pertussis (Tdap) vaccine  You may need a Td booster every 10 years. Varicella (chickenpox) vaccine  You may need this vaccine if you have not already been vaccinated. Zoster (shingles) vaccine  You may need this after age 64. Measles, mumps, and rubella (MMR) vaccine  You may need at least one dose of MMR if you were born in 1957 or later. You may also need a second dose. Pneumococcal conjugate (PCV13) vaccine  You may need this if you have certain conditions and were not previously vaccinated. Pneumococcal polysaccharide (PPSV23) vaccine  You may need one or two doses if you smoke cigarettes or if you have certain conditions. Meningococcal conjugate (MenACWY) vaccine  You may need this if you have certain conditions. Hepatitis A  vaccine  You may need this if you have certain conditions or if you travel or work in places where you may be exposed to hepatitis A. Hepatitis B vaccine  You may need this if you have certain conditions or if you travel or work in places where you may be exposed to hepatitis B. Haemophilus influenzae type b (Hib) vaccine  You may need this if you have certain risk factors. Human papillomavirus (HPV) vaccine  If recommended by your health care provider, you may need three doses over 6 months. You may receive vaccines as individual doses or as more than one vaccine together in one shot (combination vaccines). Talk with your health care provider about the risks and benefits of combination vaccines. What tests do I need? Blood tests  Lipid and cholesterol levels. These may be checked every 5 years, or more frequently if you are over 60 years old.  Hepatitis C test.  Hepatitis B test. Screening  Lung cancer screening. You may have this screening every year starting at age 43 if you have a 30-pack-year history of smoking and currently smoke or have quit within the past 15 years.  Prostate cancer screening. Recommendations will vary depending on your family history and other risks.  Colorectal cancer screening. All adults should have this screening starting at age 72 and continuing until age 2. Your health care provider may recommend screening at age 14 if you are at increased risk. You will have tests every 1-10 years, depending on your results and the type of screening test.  Diabetes screening. This is done by checking your blood sugar (glucose) after you have not eaten  for a while (fasting). You may have this done every 1-3 years.  Sexually transmitted disease (STD) testing. Follow these instructions at home: Eating and drinking  Eat a diet that includes fresh fruits and vegetables, whole grains, lean protein, and low-fat dairy products.  Take vitamin and mineral supplements as  recommended by your health care provider.  Do not drink alcohol if your health care provider tells you not to drink.  If you drink alcohol: ? Limit how much you have to 0-2 drinks a day. ? Be aware of how much alcohol is in your drink. In the [place] one drink equals one 12 oz bottle of beer (355 mL), one 5 oz glass of wine (148 mL), or one 1 oz glass of hard liquor (44 mL). Lifestyle  Take daily care of your teeth and gums.  Stay active. Exercise for at least 30 minutes on 5 or more days each week.  Do not use any products that contain nicotine or tobacco, such as cigarettes, e-cigarettes, and chewing tobacco. If you need help quitting, ask your health care provider.  If you are sexually active, practice safe sex. Use a condom or other form of protection to prevent STIs (sexually transmitted infections).  Talk with your health care provider about taking a low-dose aspirin every day starting at age 12. What's next?  Go to your health care provider once a year for a well check visit.  Ask your health care provider how often you should have your eyes and teeth checked.  Stay up to date on all vaccines. This information is not intended to replace advice given to you by your health care provider. Make sure you discuss any questions you have with your health care provider. Document Revised: 12/02/2018 Document Reviewed: 12/02/2018 Elsevier Patient Education  2020  Health Maintenance, Male Adopting a healthy lifestyle and getting preventive care are important in promoting health and wellness. Ask your health care provider about:  The right schedule for you to have regular tests and exams.  Things you can do on your own to prevent diseases and keep yourself healthy. What should I know about diet, weight, and exercise? Eat a healthy diet   Eat a diet that includes plenty of vegetables, fruits, low-fat dairy products, and lean protein.  Do not eat a lot of foods that are high in solid  fats, added sugars, or sodium. Maintain a healthy weight Body mass index (BMI) is a measurement that can be used to identify possible weight problems. It estimates body fat based on height and weight. Your health care provider can help determine your BMI and help you achieve or maintain a healthy weight. Get regular exercise Get regular exercise. This is one of the most important things you can do for your health. Most adults should:  Exercise for at least 150 minutes each week. The exercise should increase your heart rate and make you sweat (moderate-intensity exercise).  Do strengthening exercises at least twice a week. This is in addition to the moderate-intensity exercise.  Spend less time sitting. Even light physical activity can be beneficial. Watch cholesterol and blood lipids Have your blood tested for lipids and cholesterol at 63 years of age, then have this test every 5 years. You may need to have your cholesterol levels checked more often if:  Your lipid or cholesterol levels are high.  You are older than 63 years of age.  You are at high risk for heart disease. What should I know about cancer screening?  Many types of cancers can be detected early and may often be prevented. Depending on your health history and family history, you may need to have cancer screening at various ages. This may include screening for:  Colorectal cancer.  Prostate cancer.  Skin cancer.  Lung cancer. What should I know about heart disease, diabetes, and high blood pressure? Blood pressure and heart disease  High blood pressure causes heart disease and increases the risk of stroke. This is more likely to develop in people who have high blood pressure readings, are of African descent, or are overweight.  Talk with your health care provider about your target blood pressure readings.  Have your blood pressure checked: ? Every 3-5 years if you are 56-44 years of age. ? Every year if you are 85  years old or older.  If you are between the ages of 23 and 33 and are a current or former smoker, ask your health care provider if you should have a one-time screening for abdominal aortic aneurysm (AAA). Diabetes Have regular diabetes screenings. This checks your fasting blood sugar level. Have the screening done:  Once every three years after age 59 if you are at a normal weight and have a low risk for diabetes.  More often and at a younger age if you are overweight or have a high risk for diabetes. What should I know about preventing infection? Hepatitis B If you have a higher risk for hepatitis B, you should be screened for this virus. Talk with your health care provider to find out if you are at risk for hepatitis B infection. Hepatitis C Blood testing is recommended for:  Everyone born from [birth year] through 1965.  Anyone with known risk factors for hepatitis C. Sexually transmitted infections (STIs)  You should be screened each year for STIs, including gonorrhea and chlamydia, if: ? You are sexually active and are younger than 63 years of age. ? You are older than 63 years of age and your health care provider tells you that you are at risk for this type of infection. ? Your sexual activity has changed since you were last screened, and you are at increased risk for chlamydia or gonorrhea. Ask your health care provider if you are at risk.  Ask your health care provider about whether you are at high risk for HIV. Your health care provider may recommend a prescription medicine to help prevent HIV infection. If you choose to take medicine to prevent HIV, you should first get tested for HIV. You should then be tested every 3 months for as long as you are taking the medicine. Follow these instructions at home: Lifestyle  Do not use any products that contain nicotine or tobacco, such as cigarettes, e-cigarettes, and chewing tobacco. If you need help quitting, ask your health care  provider.  Do not use street drugs.  Do not share needles.  Ask your health care provider for help if you need support or information about quitting drugs. Alcohol use  Do not drink alcohol if your health care provider tells you not to drink.  If you drink alcohol: ? Limit how much you have to 0-2 drinks a day. ? Be aware of how much alcohol is in your drink. In the [place] one drink equals one 12 oz bottle of beer (355 mL), one 5 oz glass of wine (148 mL), or one 1 oz glass of hard liquor (44 mL). General instructions  Schedule regular health, dental, and eye exams.  Stay  current with your vaccines.  Tell your health care provider if: ? You often feel depressed. ? You have ever been abused or do not feel safe at home. Summary  Adopting a healthy lifestyle and getting preventive care are important in promoting health and wellness.  Follow your health care provider's instructions about healthy diet, exercising, and getting tested or screened for diseases.  Follow your health care provider's instructions on monitoring your cholesterol and blood pressure. This information is not intended to replace advice given to you by your health care provider. Make sure you discuss any questions you have with your health care provider. Document Revised: 12/01/2018 Document Reviewed: 12/01/2018 Elsevier Patient Education  2020 [employer].

## 2020-10-03 LAB — CBC WITH DIFFERENTIAL/PLATELET
Absolute Monocytes: 1143 cells/uL — ABNORMAL HIGH (ref 200–950)
Basophils Absolute: 64 cells/uL (ref 0–200)
Basophils Relative: 0.4 %
Eosinophils Absolute: 129 cells/uL (ref 15–500)
Eosinophils Relative: 0.8 %
HCT: 47.8 % (ref 38.5–50.0)
Hemoglobin: 16.5 g/dL (ref 13.2–17.1)
Lymphs Abs: 2560 cells/uL (ref 850–3900)
MCV: 96.2 fL (ref 80.0–100.0)
Monocytes Relative: 7.1 %
Neutro Abs: 12204 cells/uL — ABNORMAL HIGH (ref 1500–7800)
Neutrophils Relative %: 75.8 %
RBC: 4.97 10*6/uL (ref 4.20–5.80)
RDW: 12 % (ref 11.0–15.0)
WBC: 16.1 10*3/uL — ABNORMAL HIGH (ref 3.8–10.8)

## 2020-10-03 LAB — LIPID PANEL
Cholesterol: 123 mg/dL (ref <200)
HDL: 28 mg/dL — ABNORMAL LOW (ref >=40)
LDL Cholesterol (Calc): 77 mg/dL (calc)
Non-HDL Cholesterol (Calc): 95 mg/dL (calc) (ref <130)
Total CHOL/HDL Ratio: 4.4 (calc) (ref <5.0)
Triglycerides: 98 mg/dL (ref <150)

## 2020-10-03 LAB — COMPLETE METABOLIC PANEL WITH GFR
AG Ratio: 1.5 (calc) (ref 1.0–2.5)
ALT: 24 U/L (ref 9–46)
AST: 18 U/L (ref 10–35)
Albumin: 4.4 g/dL (ref 3.6–5.1)
Alkaline phosphatase (APISO): 62 U/L (ref 35–144)
BUN: 12 mg/dL (ref 7–25)
CO2: 27 mmol/L (ref 20–32)
Calcium: 9.6 mg/dL (ref 8.6–10.3)
Chloride: 104 mmol/L (ref 98–110)
Creat: 0.87 mg/dL (ref 0.70–1.25)
GFR, Est African American: 106 mL/min/{1.73_m2} (ref >=60)
GFR, Est Non African American: 92 mL/min/{1.73_m2} (ref >=60)
Globulin: 2.9 g/dL (calc) (ref 1.9–3.7)
Glucose, Bld: 89 mg/dL (ref 65–99)
Potassium: 4.8 mmol/L (ref 3.5–5.3)
Sodium: 139 mmol/L (ref 135–146)
Total Bilirubin: 0.6 mg/dL (ref 0.2–1.2)
Total Protein: 7.3 g/dL (ref 6.1–8.1)

## 2020-10-03 LAB — HEMOGLOBIN A1C
Hgb A1c MFr Bld: 5.8 % of total Hgb — ABNORMAL HIGH (ref <5.7)
Mean Plasma Glucose: 120 (calc)
eAG (mmol/L): 6.6 (calc)

## 2020-10-03 NOTE — Addendum Note (Signed)
Addended by: Danelle Berry on: 10/03/2020 12:45 PM   Modules accepted: Orders

## 2020-10-15 LAB — CBC WITH DIFFERENTIAL/PLATELET
Absolute Monocytes: 1060 cells/uL — ABNORMAL HIGH (ref 200–950)
Basophils Absolute: 56 cells/uL (ref 0–200)
Basophils Relative: 0.6 %
Eosinophils Absolute: 279 cells/uL (ref 15–500)
Eosinophils Relative: 3 %
HCT: 45.3 % (ref 38.5–50.0)
Hemoglobin: 15.2 g/dL (ref 13.2–17.1)
Lymphs Abs: 3506 cells/uL (ref 850–3900)
MCH: 32.7 pg (ref 27.0–33.0)
MCHC: 33.6 g/dL (ref 32.0–36.0)
MCV: 97.4 fL (ref 80.0–100.0)
MPV: 10.4 fL (ref 7.5–12.5)
Monocytes Relative: 11.4 %
Neutro Abs: 4399 cells/uL (ref 1500–7800)
Neutrophils Relative %: 47.3 %
Platelets: 272 10*3/uL (ref 140–400)
RBC: 4.65 10*6/uL (ref 4.20–5.80)
RDW: 12.1 % (ref 11.0–15.0)
Total Lymphocyte: 37.7 %
WBC: 9.3 10*3/uL (ref 3.8–10.8)

## 2020-12-19 ENCOUNTER — Telehealth: Payer: Self-pay | Admitting: *Deleted

## 2020-12-19 ENCOUNTER — Encounter: Payer: Self-pay | Admitting: *Deleted

## 2020-12-19 NOTE — Telephone Encounter (Signed)
Attempted to contact and schedule lung screening scan. Message left for patient to call back to schedule. 

## 2021-01-07 ENCOUNTER — Other Ambulatory Visit: Payer: Self-pay | Admitting: *Deleted

## 2021-01-07 DIAGNOSIS — Z122 Encounter for screening for malignant neoplasm of respiratory organs: Secondary | ICD-10-CM

## 2021-01-07 DIAGNOSIS — Z87891 Personal history of nicotine dependence: Secondary | ICD-10-CM

## 2021-01-07 NOTE — Progress Notes (Signed)
Contacted and scheduled for annual lung screening scan. Patient is a current smoker with a 49.5 pack year history.  

## 2021-01-14 ENCOUNTER — Other Ambulatory Visit: Payer: Self-pay

## 2021-01-14 ENCOUNTER — Ambulatory Visit
Admission: RE | Admit: 2021-01-14 | Discharge: 2021-01-14 | Disposition: A | Discharge status: Home / Self Care | Payer: 59 | Source: Ambulatory Visit | Attending: Oncology | Admitting: Oncology

## 2021-01-14 DIAGNOSIS — Z122 Encounter for screening for malignant neoplasm of respiratory organs: Secondary | ICD-10-CM | POA: Diagnosis present

## 2021-01-14 DIAGNOSIS — Z87891 Personal history of nicotine dependence: Secondary | ICD-10-CM | POA: Insufficient documentation

## 2021-01-16 ENCOUNTER — Encounter: Payer: Self-pay | Admitting: *Deleted

## 2021-04-02 ENCOUNTER — Encounter: Payer: Self-pay | Admitting: Family Medicine

## 2021-04-02 ENCOUNTER — Ambulatory Visit (INDEPENDENT_AMBULATORY_CARE_PROVIDER_SITE_OTHER): Payer: 59 | Admitting: Family Medicine

## 2021-04-02 ENCOUNTER — Other Ambulatory Visit: Payer: Self-pay

## 2021-04-02 VITALS — BP 120/72 | HR 88 | Temp 98.4°F | Resp 18 | Ht 69.0 in | Wt 180.5 lb

## 2021-04-02 DIAGNOSIS — I10 Essential (primary) hypertension: Secondary | ICD-10-CM | POA: Diagnosis not present

## 2021-04-02 DIAGNOSIS — J438 Other emphysema: Secondary | ICD-10-CM | POA: Diagnosis not present

## 2021-04-02 DIAGNOSIS — R7303 Prediabetes: Secondary | ICD-10-CM

## 2021-04-02 DIAGNOSIS — E785 Hyperlipidemia, unspecified: Secondary | ICD-10-CM | POA: Diagnosis not present

## 2021-04-02 DIAGNOSIS — E786 Lipoprotein deficiency: Secondary | ICD-10-CM

## 2021-04-02 MED ORDER — LOSARTAN POTASSIUM 25 MG PO TABS: 25.0000 mg | ORAL_TABLET | Freq: Every day | ORAL | 3 refills | Status: DC

## 2021-04-02 MED ORDER — ATORVASTATIN CALCIUM 40 MG PO TABS: ORAL_TABLET | ORAL | 3 refills | Status: DC

## 2021-04-02 NOTE — Progress Notes (Signed)
Name: Travis Watson   MRN: [id number]    DOB: Jul 12, 1956   Date:04/02/2021       Progress Note  Chief Complaint  Patient presents with  . Hyperlipidemia     Subjective:   Travis Watson is a 65 y.o. male, presents to clinic for   Hypertension:  Currently managed on losartan 25 mg daily Pt reports good med compliance and denies any SE.   Blood pressure today is well controlled.  Previously had lightheaded episodes and med dose was reduced, he only occasionally gets lightheaded if he stands up too fast but it only lasts a few seconds, no syncope, hasn't ever checked BP or VS when this happens.   BP Readings from Last 3 Encounters:  04/02/21 120/72  10/02/20 128/72  01/26/20 122/72  Pt denies CP, SOB, exertional sx, LE edema, palpitation, Ha's, visual disturbances, lightheadedness, hypotension, syncope.   Hyperlipidemia: Currently treated with lipitor 40 mg, pt reports good med compliance Last Lipids:  Chronically low HDL - his baseline Lab Results  Component Value Date   CHOL 123 10/02/2020   HDL 28 (L) 10/02/2020   LDLCALC 77 10/02/2020   TRIG 98 10/02/2020   CHOLHDL 4.4 10/02/2020   - Denies: Chest pain, shortness of breath, myalgias, claudication  Smoking hx - current smoker,last 3 CT chest for lung CA screening reviewed, several years ago there was an incidental finding on CT -  Pt denies any exertional dyspnea, chronic cough, frequent bronchitis, chest pain   Current Outpatient Medications:  .  atorvastatin (LIPITOR) 40 MG tablet, TAKE 1 TABLET BY MOUTH EVERYDAY AT BEDTIME, Disp: 90 tablet, Rfl: 3 .  fluocinonide cream (LIDEX) 0.05 %, APPLY ON THE SKIN TWICE A DAY AS NEEDED, Disp: , Rfl: 6 .  losartan (COZAAR) 25 MG tablet, Take 1 tablet (25 mg total) by mouth daily., Disp: 90 tablet, Rfl: 3  Patient Active Problem List   Diagnosis Date Noted  . Overweight (BMI 25.0-29.9) 07/11/2019  . Hx of colonic polyps 12/30/2018  . Renal cyst 10/19/2018   . 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 Low HDL (under 40) 06/11/2018  . Prediabetes 05/29/2018  . Degenerative disc disease, cervical 11/25/2016  . 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 Tobacco abuse counseling 12/25/2015  . Hypertension 08/23/2015  . Hyperlipidemia 08/23/2015    No past surgical history on file.  Family History  Problem Relation Age of Onset  . Diabetes Mother   . Emphysema Mother   . Hyperlipidemia Father   . Cancer Father        prostate  . Heart disease Maternal Grandmother   . Hypertension Maternal Grandmother   . Heart disease Maternal Grandfather   . Hypertension Maternal Grandfather   . Migraines Paternal Grandmother     Social History   Tobacco Use  . Smoking status: Current Every Day Smoker    Packs/day: 1.00    Years: 49.00    Pack years: 49.00    Types: Cigarettes  . Smokeless tobacco: Former Neurosurgeon    Types: Snuff  . Tobacco comment: quit smokeless tabacco about 40 years ago  Vaping Use  . Vaping Use: Never used  Substance Use Topics  . Alcohol use: Yes    Alcohol/week: 0.0 standard drinks    Comment: occasional  . Drug use: No     No Known Allergies  Health Maintenance  Topic Date Due  . COVID-19 Vaccine (1) 04/18/2021 (Originally 03/31/1961)  . TETANUS/TDAP  04/02/2022 (Originally 03/31/1976)  . INFLUENZA VACCINE  07/22/2021  . COLONOSCOPY (Pts 45-71yrs Insurance coverage will need to be confirmed)  04/30/2023  . Hepatitis C Screening  Completed  . HIV Screening  Completed  . HPV VACCINES  Aged Out    Chart Review Today: I personally reviewed active problem list, medication list, allergies, family history, social history, health maintenance, notes from last encounter, lab results, imaging with the patient/caregiver today.  Review of Systems  Constitutional: Negative.   HENT: Negative.   Eyes: Negative.   Respiratory: Negative.   Cardiovascular: Negative.   Gastrointestinal: Negative.    Endocrine: Negative.   Genitourinary: Negative.   Musculoskeletal: Negative.   Skin: Negative.   Allergic/Immunologic: Negative.   Neurological: Negative.   Hematological: Negative.   Psychiatric/Behavioral: Negative.   All other systems reviewed and are negative.    Objective:   Vitals:   04/02/21 0812  BP: 120/72  Pulse: 88  Resp: 18  Temp: 98.4 F (36.9 C)  SpO2: 99%  Weight: 180 lb 8 oz (81.9 kg)  Height: 5\' 9"  (1.753 m)    Body mass index is 26.66 kg/m.  Physical Exam Vitals and nursing note reviewed.  Constitutional:      General: He is not in acute distress.    Appearance: Normal appearance. He is well-developed. He is not ill-appearing, toxic-appearing or diaphoretic.     Interventions: Face mask in place.  HENT:     Head: Normocephalic and atraumatic.     Jaw: No trismus.     Right Ear: External ear normal.     Left Ear: External ear normal.  Eyes:     General: Lids are normal. No scleral icterus.       Right eye: No discharge.        Left eye: No discharge.     Conjunctiva/sclera: Conjunctivae normal.  Neck:     Trachea: Trachea and phonation normal. No tracheal deviation.  Cardiovascular:     Rate and Rhythm: Normal rate and regular rhythm.     Pulses: Normal pulses.          Radial pulses are 2+ on the right side and 2+ on the left side.       Posterior tibial pulses are 2+ on the right side and 2+ on the left side.     Heart sounds: Normal heart sounds. No murmur heard. No friction rub. No gallop.   Pulmonary:     Effort: Pulmonary effort is normal. No respiratory distress.     Breath sounds: Normal breath sounds. No stridor. No wheezing, rhonchi or rales.  Abdominal:     General: Bowel sounds are normal. There is no distension.     Palpations: Abdomen is soft.  Musculoskeletal:     Right lower leg: No edema.     Left lower leg: No edema.  Skin:    General: Skin is warm and dry.     Coloration: Skin is not jaundiced.     Findings: No  rash.     Nails: There is no clubbing.  Neurological:     Mental Status: He is alert. Mental status is at baseline.     Cranial Nerves: No dysarthria or facial asymmetry.     Motor: No tremor or abnormal muscle tone.     Gait: Gait normal.  Psychiatric:        Mood and Affect: Mood normal.        Speech: Speech normal.  Behavior: Behavior normal. Behavior is cooperative.         Assessment & Plan:   1. Hyperlipidemia with low HDL Reviewed last labs, LDL well controlled, HDL chronically low and at pt baseline, he is tolerating statin, no CP, SOB, myalgias, jaundice, claudication sx Labs will be done at next appt - atorvastatin (LIPITOR) 40 MG tablet; TAKE 1 TABLET BY MOUTH EVERYDAY AT BEDTIME  Dispense: 90 tablet; Refill: 3  2. Essential hypertension BP at goal today, has been well controlled, last labs normal, some postural orthostatic episodes with quick position changes but no syncope, no exertional sx, no hypotension - losartan (COZAAR) 25 MG tablet; Take 1 tablet (25 mg total) by mouth daily.  Dispense: 90 tablet; Refill: 3 - COMPLETE METABOLIC PANEL WITH GFR  3. Other emphysema (HCC) Pt denies any respiratory sx, no SOB, DOE, chronic cough, hematemesis, unintentional weight loss, sweats, wheeze, CT's reviewed, pt still smoking, smoking cessation/counseling given today and resources offered  4. Prediabetes Has been stable, will recheck at next OV/CPE, continue healthy diet and lifestyle    Return in about 6 months (around 10/02/2021) for Annual Physical.   Danelle Berry, PA-C 04/02/21 8:32 AM

## 2021-10-04 ENCOUNTER — Encounter: Payer: 59 | Admitting: Family Medicine

## 2021-11-21 ENCOUNTER — Encounter: Payer: Self-pay | Admitting: Internal Medicine

## 2021-11-21 ENCOUNTER — Ambulatory Visit (INDEPENDENT_AMBULATORY_CARE_PROVIDER_SITE_OTHER): Payer: 59 | Admitting: Internal Medicine

## 2021-11-21 VITALS — BP 132/64 | HR 95 | Temp 98.2°F | Resp 16 | Ht 69.0 in | Wt 175.0 lb

## 2021-11-21 DIAGNOSIS — E782 Mixed hyperlipidemia: Secondary | ICD-10-CM | POA: Diagnosis not present

## 2021-11-21 DIAGNOSIS — Z125 Encounter for screening for malignant neoplasm of prostate: Secondary | ICD-10-CM

## 2021-11-21 DIAGNOSIS — R7303 Prediabetes: Secondary | ICD-10-CM | POA: Diagnosis not present

## 2021-11-21 DIAGNOSIS — Z0000 Encounter for general adult medical examination without abnormal findings: Secondary | ICD-10-CM | POA: Diagnosis not present

## 2021-11-21 DIAGNOSIS — I10 Essential (primary) hypertension: Secondary | ICD-10-CM

## 2021-11-21 NOTE — Progress Notes (Signed)
Name: Travis Watson   MRN: [id number]    DOB: 22-Jun-1956   Date:11/21/2021       Progress Note  Subjective  Chief Complaint  Chief Complaint  Patient presents with   Annual Exam    HPI  Patient presents for annual CPE. He states he is doing well since his last visit.   HTN: -Taking Losartan 25 mg, doing well with this and reporting no side effects.  -Doesn't check blood pressure at home -Denies headaches, changes in vision, dizziness.  HLD: -Taking Lipitor 40 mg daily, reports no side effects -Reviewed last lipid panel from last year  IPSS Questionnaire (AUA-7): Over the past month.   1)  How often have you had a sensation of not emptying your bladder completely after you finish urinating?  0 - Not at all  2)  How often have you had to urinate again less than two hours after you finished urinating? 0 - Not at all  3)  How often have you found you stopped and started again several times when you urinated?  0 - Not at all  4) How difficult have you found it to postpone urination?  0 - Not at all  5) How often have you had a weak urinary stream?  0 - Not at all  6) How often have you had to push or strain to begin urination?  0 - Not at all  7) How many times did you most typically get up to urinate from the time you went to bed until the time you got up in the morning?  0 - None  Total score:  0-7 mildly symptomatic   8-19 moderately symptomatic   20-35 severely symptomatic     Diet: tries to watch what he eats, has a goal to increase veggies  Exercise: yard work, walks a lot at [employer], takes stairs at work  Depression: phq 9 is negative Depression screen [facility] 2/9 11/21/2021 04/02/2021 10/02/2020 01/12/2020 07/11/2019  Decreased Interest 0 0 0 0 0  Down, Depressed, Hopeless 0 0 0 0 0  PHQ - 2 Score 0 0 0 0 0  Altered sleeping 0 - - 0 0  Tired, decreased energy 0 - - 0 0  Change in appetite 0 - - 0 0  Feeling bad or failure about yourself  0 - - 0 0  Trouble  concentrating 0 - - 0 0  Moving slowly or fidgety/restless 0 - - 0 0  Suicidal thoughts 0 - - 0 0  PHQ-9 Score 0 - - 0 0  Difficult doing work/chores Not difficult at all - - Not difficult at all Not difficult at all    Hypertension:  BP Readings from Last 3 Encounters:  11/21/21 132/64  04/02/21 120/72  10/02/20 128/72    Obesity: Wt Readings from Last 3 Encounters:  11/21/21 175 lb (79.4 kg)  04/02/21 180 lb 8 oz (81.9 kg)  01/14/21 181 lb (82.1 kg)   BMI Readings from Last 3 Encounters:  11/21/21 25.84 kg/m  04/02/21 26.66 kg/m  01/14/21 26.73 kg/m     Lipids:  Lab Results  Component Value Date   CHOL 123 10/02/2020   CHOL 117 01/12/2020   CHOL 133 12/30/2018   Lab Results  Component Value Date   HDL 28 (L) 10/02/2020   HDL 26 (L) 01/12/2020   HDL 27 (L) 12/30/2018   Lab Results  Component Value Date   LDLCALC 77 10/02/2020   LDLCALC 72 01/12/2020  LDLCALC 87 12/30/2018   Lab Results  Component Value Date   TRIG 98 10/02/2020   TRIG 104 01/12/2020   TRIG 95 12/30/2018   Lab Results  Component Value Date   CHOLHDL 4.4 10/02/2020   CHOLHDL 4.5 01/12/2020   CHOLHDL 4.9 12/30/2018   No results found for: LDLDIRECT Glucose:  Glucose, Bld  Date Value Ref Range Status  10/02/2020 89 65 - 99 mg/dL Final    Comment:    .            Fasting reference interval .   01/12/2020 99 65 - 99 mg/dL Final    Comment:    .            Fasting reference interval .   12/30/2018 106 65 - 139 mg/dL Final    Comment:    .        Non-fasting reference interval .     Flowsheet Row Office Visit from [facility]/02/2021 [facility]  AUDIT-C Score 0      Hep C: 6/19 negative  Skin cancer: Discussed monitoring for atypical lesions Colorectal cancer: Colonoscopy 2014  Prostate cancer: PSA checked today  Lab Results  Component Value Date   PSA 0.9 06/09/2018    Lung cancer: Low Dose CT Chest recommended if Age 53-80 years, 30  pack-year currently smoking OR have quit w/in 15years. Patient does qualify.  LD CT 1/22 - Lung RADS 2, repeat in January 2023. Currently smokes 0.5 pack a day x 50 years. Not interested in quitting.   AAA: The USPSTF recommends one-time screening with ultrasonography in men ages 51 to 29 years who have ever smoked  Vaccines: politely declined all vaccines today HPV: up to at age 73 , ask insurance if age between 54-45  Shingrix: 33-64 yo and ask insurance if covered when patient above 42 yo Pneumonia:  Educated and discussed with patient. Flu: Educated and discussed with patient.  Advanced Care Planning: A voluntary discussion about advance care planning including the explanation and discussion of advance directives.  Discussed health care proxy and Living will, and the patient was able to identify a health care proxy as son Halton Neas.  Patient does not have a living will at present time. If patient does have living will, I have requested they bring this to the clinic to be scanned in to their chart.   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  Family History  Problem Relation Age of Onset   Diabetes Mother    Emphysema Mother    Hyperlipidemia Father    Cancer Father        prostate   Heart disease Maternal Grandmother    Hypertension Maternal Grandmother    Heart disease Maternal Grandfather    Hypertension Maternal Grandfather    Migraines Paternal Grandmother     Social History   Socioeconomic History   Marital status: Single     Spouse name: Not on file   Number of children: Not on file   Years of education: Not  on file   Highest education level: Not on file  Occupational History   Not on file  Tobacco Use   Smoking status: Every Day    Packs/day: 1.00    Years: 49.00    Pack years: 49.00    Types: Cigarettes   Smokeless tobacco: Former    Types: Snuff   Tobacco comments:    quit smokeless tabacco about 40 years ago  Vaping Use   Vaping Use: Never used  Substance and Sexual Activity   Alcohol use: Yes    Alcohol/week: 0.0 standard drinks    Comment: occasional   Drug use: No   Sexual activity: Not Currently  Other Topics Concern   Not on file  Social History Narrative   Not on file   Social Determinants of Health   Financial Resource Strain: Low Risk    Difficulty of Paying Living Expenses: Not hard at all  Food Insecurity: No Food Insecurity   Worried About Programme researcher, broadcasting/film/video in the Last Year: Never true   Ran Out of Food in the Last Year: Never true  Transportation Needs: No Transportation Needs   Lack of Transportation (Medical): No   Lack of Transportation (Non-Medical): No  Physical Activity: Insufficiently Active   Days of Exercise per Week: 3 days   Minutes of Exercise per Session: 20 min  Stress: No Stress Concern Present   Feeling of Stress : Not at all  Social Connections: Socially Isolated   Frequency of Communication with Friends and Family: Twice a week   Frequency of Social Gatherings with Friends and Family: Twice a week   Attends Religious Services: Never   Database administrator or Organizations: No   Attends Engineer, structural: Never   Marital Status: Divorced  Catering manager Violence: Not At Risk   Fear of Current or Ex-Partner: No   Emotionally Abused: No   Physically Abused: No   Sexually Abused: No     Current Outpatient Medications:    atorvastatin (LIPITOR) 40 MG tablet, TAKE 1 TABLET BY MOUTH EVERYDAY AT BEDTIME, Disp: 90 tablet, Rfl: 3    fluocinonide cream (LIDEX) 0.05 %, APPLY ON THE SKIN TWICE A DAY AS NEEDED, Disp: , Rfl: 6   losartan (COZAAR) 25 MG tablet, Take 1 tablet (25 mg total) by mouth daily., Disp: 90 tablet, Rfl: 3  No Known Allergies   Review of Systems  Constitutional: Negative.   HENT: Negative.    Eyes: Negative.   Respiratory: Negative.    Cardiovascular: Negative.   Gastrointestinal: Negative.   Genitourinary: Negative.   Neurological: Negative.     Objective  Vitals:   11/21/21 0936  BP: 132/64  Pulse: 95  Resp: 16  Temp: 98.2 F (36.8 C)  SpO2: 99%  Weight: 175 lb (79.4 kg)  Height: 5\' 9"  (1.753 m)    Body mass index is 25.84 kg/m.  Physical Exam Constitutional:      Appearance: Normal appearance.  HENT:     Head: Normocephalic and atraumatic.  Eyes:     Conjunctiva/sclera: Conjunctivae normal.  Cardiovascular:     Rate and Rhythm: Normal rate and regular rhythm.  Pulmonary:     Effort: Pulmonary effort is normal.     Breath sounds: Normal breath sounds.  Abdominal:     General: There is no distension.     Palpations: Abdomen is soft.     Tenderness: There is no abdominal tenderness.  Musculoskeletal:     Right lower leg:  No edema.     Left lower leg: No edema.  Skin:    General: Skin is warm and dry.  Neurological:     General: No focal deficit present.     Mental Status: He is alert. Mental status is at baseline.  Psychiatric:        Mood and Affect: Mood normal.        Behavior: Behavior normal.    No results found for this or any previous visit (from the past 2160 hour(s)).   Fall Risk: Fall Risk  11/21/2021 04/02/2021 10/02/2020 01/12/2020 07/11/2019  Falls in the past year? 0 0 0 0 0  Number falls in past yr: 0 0 0 0 0  Injury with Fall? 0 0 0 0 0  Follow up - - Falls evaluation completed Falls evaluation completed Falls evaluation completed     Functional Status Survey: Is the patient deaf or have difficulty hearing?: No Does the patient have  difficulty seeing, even when wearing glasses/contacts?: No Does the patient have difficulty concentrating, remembering, or making decisions?: No Does the patient have difficulty walking or climbing stairs?: No Does the patient have difficulty dressing or bathing?: No Does the patient have difficulty doing errands alone such as visiting a doctor's office or shopping?: No   Assessment & Plan  1. Annual physical exam/Essential hypertension/Prediabetes/Mixed hyperlipidemia/Screening for prostate cancer: Blood pressure stable today, doing well on ARB and statin. Due for annual blood work. Discussed reordering low dose CT scan for lung cancer screening, states last time he has an issue with his insurance and would like to discuss with them before ordering. Politely declines vaccines today. Follow up in 6 months for regular medical follow up.   - Hemoglobin A1c - COMPLETE METABOLIC PANEL WITH GFR - CBC with Differential/Platelet - Lipid panel - PSA  Follow up: 6 months  -Prostate cancer screening and PSA options (with potential risks and benefits of testing vs not testing) were discussed along with recent recs/guidelines. -USPSTF grade A and B recommendations reviewed with patient; age-appropriate recommendations, preventive care, screening tests, etc discussed and encouraged; healthy living encouraged; see AVS for patient education given to patient -Discussed importance of 150 minutes of physical activity weekly, eat two servings of fish weekly, eat one serving of tree nuts ( cashews, pistachios, pecans, almonds.Marland Kitchen) every other day, eat 6 servings of fruit/vegetables daily and drink plenty of water and avoid sweet beverages.  -Reviewed Health Maintenance: yes

## 2021-11-21 NOTE — Patient Instructions (Addendum)
It was great seeing you today!  Plan discussed at today's visit: -Blood work ordered today, results will be uploaded to MyChart.  -Consider Pneumonia vaccine   Follow up in: 6 months  Take care and let us know if you have any questions or concerns prior to your next visit.  Dr. Caralee Ates  Health Maintenance, Male Adopting a healthy lifestyle and getting preventive care are important in promoting health and wellness. Ask your health care provider about: The right schedule for you to have regular tests and exams. Things you can do on your own to prevent diseases and keep yourself healthy. What should I know about diet, weight, and exercise? Eat a healthy diet  Eat a diet that includes plenty of vegetables, fruits, low-fat dairy products, and lean protein. Do not eat a lot of foods that are high in solid fats, added sugars, or sodium. Maintain a healthy weight Body mass index (BMI) is a measurement that can be used to identify possible weight problems. It estimates body fat based on height and weight. Your health care provider can help determine your BMI and help you achieve or maintain a healthy weight. Get regular exercise Get regular exercise. This is one of the most important things you can do for your health. Most adults should: Exercise for at least 150 minutes each week. The exercise should increase your heart rate and make you sweat (moderate-intensity exercise). Do strengthening exercises at least twice a week. This is in addition to the moderate-intensity exercise. Spend less time sitting. Even light physical activity can be beneficial. Watch cholesterol and blood lipids Have your blood tested for lipids and cholesterol at 64 years of age, then have this test every 5 years. You may need to have your cholesterol levels checked more often if: Your lipid or cholesterol levels are high. You are older than 64 years of age. You are at high risk for heart disease. What should I know  about cancer screening? Many types of cancers can be detected early and may often be prevented. Depending on your health history and family history, you may need to have cancer screening at various ages. This may include screening for: Colorectal cancer. Prostate cancer. Skin cancer. Lung cancer. What should I know about heart disease, diabetes, and high blood pressure? Blood pressure and heart disease High blood pressure causes heart disease and increases the risk of stroke. This is more likely to develop in people who have high blood pressure readings or are overweight. Talk with your health care provider about your target blood pressure readings. Have your blood pressure checked: Every 3-5 years if you are 50-34 years of age. Every year if you are 68 years old or older. If you are between the ages of 53 and 41 and are a current or former smoker, ask your health care provider if you should have a one-time screening for abdominal aortic aneurysm (AAA). Diabetes Have regular diabetes screenings. This checks your fasting blood sugar level. Have the screening done: Once every three years after age 82 if you are at a normal weight and have a low risk for diabetes. More often and at a younger age if you are overweight or have a high risk for diabetes. What should I know about preventing infection? Hepatitis B If you have a higher risk for hepatitis B, you should be screened for this virus. Talk with your health care provider to find out if you are at risk for hepatitis B infection. Hepatitis C Blood testing  is recommended for: Everyone born from [birth year] through 1964. Anyone with known risk factors for hepatitis C. Sexually transmitted infections (STIs) You should be screened each year for STIs, including gonorrhea and chlamydia, if: You are sexually active and are younger than 64 years of age. You are older than 64 years of age and your health care provider tells you that you are at risk for  this type of infection. Your sexual activity has changed since you were last screened, and you are at increased risk for chlamydia or gonorrhea. Ask your health care provider if you are at risk. Ask your health care provider about whether you are at high risk for HIV. Your health care provider may recommend a prescription medicine to help prevent HIV infection. If you choose to take medicine to prevent HIV, you should first get tested for HIV. You should then be tested every 3 months for as long as you are taking the medicine. Follow these instructions at home: Alcohol use Do not drink alcohol if your health care provider tells you not to drink. If you drink alcohol: Limit how much you have to 0-2 drinks a day. Know how much alcohol is in your drink. In the [place] one drink equals one 12 oz bottle of beer (355 mL), one 5 oz glass of wine (148 mL), or one 1 oz glass of hard liquor (44 mL). Lifestyle Do not use any products that contain nicotine or tobacco. These products include cigarettes, chewing tobacco, and vaping devices, such as e-cigarettes. If you need help quitting, ask your health care provider. Do not use street drugs. Do not share needles. Ask your health care provider for help if you need support or information about quitting drugs. General instructions Schedule regular health, dental, and eye exams. Stay current with your vaccines. Tell your health care provider if: You often feel depressed. You have ever been abused or do not feel safe at home. Summary Adopting a healthy lifestyle and getting preventive care are important in promoting health and wellness. Follow your health care provider's instructions about healthy diet, exercising, and getting tested or screened for diseases. Follow your health care provider's instructions on monitoring your cholesterol and blood pressure. This information is not intended to replace advice given to you by your health care provider. Make sure  you discuss any questions you have with your health care provider. Document Revised: 04/29/2021 Document Reviewed: 04/29/2021 Elsevier Patient Education  2022 [employer].

## 2021-11-22 LAB — LIPID PANEL
Cholesterol: 109 mg/dL (ref <200)
HDL: 27 mg/dL — ABNORMAL LOW (ref >=40)
LDL Cholesterol (Calc): 66 mg/dL (calc)
Non-HDL Cholesterol (Calc): 82 mg/dL (calc) (ref <130)
Total CHOL/HDL Ratio: 4 (calc) (ref <5.0)
Triglycerides: 82 mg/dL (ref <150)

## 2021-11-22 LAB — COMPLETE METABOLIC PANEL WITH GFR
AG Ratio: 1.6 (calc) (ref 1.0–2.5)
ALT: 22 U/L (ref 9–46)
AST: 18 U/L (ref 10–35)
Albumin: 4.1 g/dL (ref 3.6–5.1)
Alkaline phosphatase (APISO): 61 U/L (ref 35–144)
BUN: 15 mg/dL (ref 7–25)
CO2: 28 mmol/L (ref 20–32)
Calcium: 8.8 mg/dL (ref 8.6–10.3)
Chloride: 105 mmol/L (ref 98–110)
Creat: 0.77 mg/dL (ref 0.70–1.35)
Globulin: 2.6 g/dL (calc) (ref 1.9–3.7)
Glucose, Bld: 90 mg/dL (ref 65–99)
Potassium: 4.4 mmol/L (ref 3.5–5.3)
Sodium: 140 mmol/L (ref 135–146)
Total Bilirubin: 0.6 mg/dL (ref 0.2–1.2)
Total Protein: 6.7 g/dL (ref 6.1–8.1)
eGFR: 100 mL/min/{1.73_m2} (ref >=60)

## 2021-11-22 LAB — HEMOGLOBIN A1C
Hgb A1c MFr Bld: 5.8 % of total Hgb — ABNORMAL HIGH (ref <5.7)
Mean Plasma Glucose: 120 mg/dL
eAG (mmol/L): 6.6 mmol/L

## 2021-11-22 LAB — CBC WITH DIFFERENTIAL/PLATELET
Absolute Monocytes: 728 cells/uL (ref 200–950)
Basophils Absolute: 29 cells/uL (ref 0–200)
Basophils Relative: 0.3 %
Eosinophils Absolute: 97 cells/uL (ref 15–500)
Eosinophils Relative: 1 %
HCT: 43.7 % (ref 38.5–50.0)
Hemoglobin: 14.9 g/dL (ref 13.2–17.1)
Lymphs Abs: 2328 cells/uL (ref 850–3900)
MCH: 33.1 pg — ABNORMAL HIGH (ref 27.0–33.0)
MCHC: 34.1 g/dL (ref 32.0–36.0)
MCV: 97.1 fL (ref 80.0–100.0)
MPV: 10.2 fL (ref 7.5–12.5)
Monocytes Relative: 7.5 %
Neutro Abs: 6518 cells/uL (ref 1500–7800)
Neutrophils Relative %: 67.2 %
Platelets: 276 10*3/uL (ref 140–400)
RBC: 4.5 10*6/uL (ref 4.20–5.80)
RDW: 11.8 % (ref 11.0–15.0)
Total Lymphocyte: 24 %
WBC: 9.7 10*3/uL (ref 3.8–10.8)

## 2021-11-22 LAB — PSA: PSA: 0.79 ng/mL (ref <=4.00)

## 2022-02-13 ENCOUNTER — Other Ambulatory Visit: Payer: Self-pay | Admitting: Family Medicine

## 2022-02-13 DIAGNOSIS — I10 Essential (primary) hypertension: Secondary | ICD-10-CM

## 2022-02-14 NOTE — Addendum Note (Signed)
Encounter addended by: Novella Olive on: 02/14/2022 11:11 AM  Actions taken: Letter saved

## 2022-02-20 ENCOUNTER — Telehealth: Payer: Self-pay | Admitting: *Deleted

## 2022-02-20 NOTE — Telephone Encounter (Signed)
LMTC to schedule Yearly Lung CA CT Scan. 

## 2022-04-06 ENCOUNTER — Other Ambulatory Visit: Payer: Self-pay | Admitting: Family Medicine

## 2022-04-06 DIAGNOSIS — E785 Hyperlipidemia, unspecified: Secondary | ICD-10-CM

## 2022-05-22 NOTE — Progress Notes (Unsigned)
   Established Patient Office Visit  Subjective   Patient ID: Travis Watson, male    DOB: Jul 18, 1956  Age: 66 y.o. MRN: [id number]  No chief complaint on file.   HPI Travis Watson is a 66 year old male here for follow up on chronic medial conditions.   Hypertension: -Medications: Losartan 25 mg  -Patient is compliant with above medications and reports no side effects. -Checking BP at home (average): *** -Highest BP at home: *** -Lowest BP at home: *** -Denies any SOB, CP, vision changes, LE edema or symptoms of hypotension -Diet: *** -Exercise: ***  HLD: -Medications: Lipitor 40 mg  -Patient is compliant with above medications and reports no side effects. *** -Last lipid panel: Lipid Panel     Component Value Date/Time   CHOL 109 11/21/2021 1008   CHOL 153 06/25/2016 0847   TRIG 82 11/21/2021 1008   HDL 27 (L) 11/21/2021 1008   HDL 31 (L) 06/25/2016 0847   CHOLHDL 4.0 11/21/2021 1008   VLDL 17 07/22/2017 0849   LDLCALC 66 11/21/2021 1008   LABVLDL 23 06/25/2016 0847   Health Maintenance: -Blood work UTD -Due for Tdap, Prevnar 20 -Colon cancer screening: ?   {History (Optional):23778}  ROS    Objective:     There were no vitals taken for this visit. {Vitals History (Optional):23777}  Physical Exam   No results found for any visits on 05/23/22.  {Labs (Optional):23779}  The ASCVD Risk score (Arnett DK, et al., 2019) failed to calculate for the following reasons:   The valid total cholesterol range is 130 to 320 mg/dL    Assessment & Plan:   Problem List Items Addressed This Visit   None   No follow-ups on file.    Margarita Mail, DO

## 2022-05-23 ENCOUNTER — Encounter: Payer: Self-pay | Admitting: Internal Medicine

## 2022-05-23 ENCOUNTER — Ambulatory Visit (INDEPENDENT_AMBULATORY_CARE_PROVIDER_SITE_OTHER): Payer: Medicare Other | Admitting: Internal Medicine

## 2022-05-23 VITALS — BP 124/68 | HR 93 | Temp 98.0°F | Resp 16 | Ht 69.0 in | Wt 178.7 lb

## 2022-05-23 DIAGNOSIS — R195 Other fecal abnormalities: Secondary | ICD-10-CM

## 2022-05-23 DIAGNOSIS — I10 Essential (primary) hypertension: Secondary | ICD-10-CM

## 2022-05-23 DIAGNOSIS — R7303 Prediabetes: Secondary | ICD-10-CM

## 2022-05-23 DIAGNOSIS — E782 Mixed hyperlipidemia: Secondary | ICD-10-CM | POA: Diagnosis not present

## 2022-05-23 DIAGNOSIS — Z1211 Encounter for screening for malignant neoplasm of colon: Secondary | ICD-10-CM

## 2022-05-23 NOTE — Assessment & Plan Note (Signed)
Last A1c 5.8%, continue to work on decreasing sugars and carbs in the diet and increase activity.

## 2022-05-23 NOTE — Patient Instructions (Signed)
It was great seeing you today!  Plan discussed at today's visit: -No changes made to medications today -Cologuard ordered today too for colon cancer screening   Follow up in: 6 months   Take care and let us know if you have any questions or concerns prior to your next visit.  Dr. Caralee Ates

## 2022-05-23 NOTE — Assessment & Plan Note (Signed)
Chronic and stable. Continue Losartan 25 mg today. Follow up in 6 months for recheck.

## 2022-05-23 NOTE — Assessment & Plan Note (Signed)
Chronic and stable. Continue Lipitor 40 mg daily. Reviewed last lipid panel with the patient.

## 2022-06-16 ENCOUNTER — Telehealth: Payer: Self-pay

## 2022-06-16 NOTE — Telephone Encounter (Signed)
CALLED PATIENT NO ANSWER LEFT VOICEMAIL FOR A CALL BACK ? ?

## 2022-06-18 ENCOUNTER — Other Ambulatory Visit: Payer: Self-pay

## 2022-06-18 ENCOUNTER — Telehealth: Payer: Self-pay

## 2022-06-18 DIAGNOSIS — R195 Other fecal abnormalities: Secondary | ICD-10-CM

## 2022-06-18 MED ORDER — NA SULFATE-K SULFATE-MG SULF 17.5-3.13-1.6 GM/177ML PO SOLN: 1.0000 | Freq: Once | ORAL | 0 refills | Status: AC

## 2022-06-18 NOTE — Progress Notes (Signed)
Gastroenterology Pre-Procedure Review  Request Date: 08/01/2022 Requesting Physician: Dr. Allegra Lai   PATIENT REVIEW QUESTIONS: The patient responded to the following health history questions as indicated:    1. Are you having any GI issues? no 2. Do you have a personal history of Polyps? yes (last colonoscopy and positive colo guard ) 3. Do you have a family history of Colon Cancer or Polyps? no 4. Diabetes Mellitus? no 5. Joint replacements in the past 12 months?no 6. Major health problems in the past 3 months?no 7. Any artificial heart valves, MVP, or defibrillator?no    MEDICATIONS & ALLERGIES:    Patient reports the following regarding taking any anticoagulation/antiplatelet therapy:   Plavix, Coumadin, Eliquis, Xarelto, Lovenox, Pradaxa, Brilinta, or Effient? no Aspirin? no  Patient confirms/reports the following medications:  Current Outpatient Medications  Medication Sig Dispense Refill   atorvastatin (LIPITOR) 40 MG tablet TAKE 1 TABLET BY MOUTH EVERYDAY AT BEDTIME 30 tablet 11   fluocinonide cream (LIDEX) 0.05 % APPLY ON THE SKIN TWICE A DAY AS NEEDED  6   losartan (COZAAR) 25 MG tablet TAKE 1 TABLET (25 MG TOTAL) BY MOUTH DAILY. 30 tablet 11   No current facility-administered medications for this visit.    Patient confirms/reports the following allergies:  No Known Allergies  No orders of the defined types were placed in this encounter.   AUTHORIZATION INFORMATION Primary Insurance: 1D#: Group #:  Secondary Insurance: 1D#: Group #:  SCHEDULE INFORMATION: Date: 08/01/2022 Time: Location:[facility]

## 2022-06-18 NOTE — Telephone Encounter (Signed)
CALLED PATIENT NO ANSWER LEFT VOICEMAIL FOR A CALL BACK °Letter sent °

## 2022-06-18 NOTE — Telephone Encounter (Signed)
Patient left vm to schedule colonoscopy. Requesting call back.  

## 2022-07-21 ENCOUNTER — Telehealth: Payer: Self-pay

## 2022-07-21 ENCOUNTER — Telehealth: Payer: Self-pay | Admitting: Gastroenterology

## 2022-07-21 NOTE — Telephone Encounter (Signed)
Patients colonoscopy has been rescheduled to Friday 08/29/22 with Dr. Allegra Lai due to transportation.  Thanks, [place], [place]

## 2022-07-21 NOTE — Telephone Encounter (Signed)
Patient wanting to reschedule colonoscopy. Please return call.

## 2022-07-21 NOTE — Telephone Encounter (Signed)
LVM returning patients call to reschedule his 08/01/22 colonoscopy with Dr. Allegra Lai.  Asked him to call me back to reschedule.  Thanks, [place], [place]

## 2022-08-24 ENCOUNTER — Other Ambulatory Visit: Payer: Self-pay

## 2022-08-24 ENCOUNTER — Observation Stay
Admission: EM | Admit: 2022-08-24 | Discharge: 2022-08-25 | Disposition: A | Discharge status: Home / Self Care | Payer: Medicare Other | Attending: Internal Medicine | Admitting: Internal Medicine

## 2022-08-24 ENCOUNTER — Emergency Department: Payer: Medicare Other

## 2022-08-24 ENCOUNTER — Encounter: Payer: Self-pay | Admitting: Emergency Medicine

## 2022-08-24 DIAGNOSIS — R200 Anesthesia of skin: Secondary | ICD-10-CM | POA: Diagnosis present

## 2022-08-24 DIAGNOSIS — Z79899 Other long term (current) drug therapy: Secondary | ICD-10-CM | POA: Insufficient documentation

## 2022-08-24 DIAGNOSIS — F17210 Nicotine dependence, cigarettes, uncomplicated: Secondary | ICD-10-CM | POA: Insufficient documentation

## 2022-08-24 DIAGNOSIS — Z716 Tobacco abuse counseling: Secondary | ICD-10-CM

## 2022-08-24 DIAGNOSIS — R7303 Prediabetes: Secondary | ICD-10-CM | POA: Diagnosis not present

## 2022-08-24 DIAGNOSIS — I639 Cerebral infarction, unspecified: Principal | ICD-10-CM

## 2022-08-24 DIAGNOSIS — Z6826 Body mass index (BMI) 26.0-26.9, adult: Secondary | ICD-10-CM | POA: Insufficient documentation

## 2022-08-24 DIAGNOSIS — J438 Other emphysema: Secondary | ICD-10-CM | POA: Diagnosis present

## 2022-08-24 DIAGNOSIS — I5032 Chronic diastolic (congestive) heart failure: Secondary | ICD-10-CM | POA: Diagnosis present

## 2022-08-24 DIAGNOSIS — J449 Chronic obstructive pulmonary disease, unspecified: Secondary | ICD-10-CM | POA: Insufficient documentation

## 2022-08-24 DIAGNOSIS — I10 Essential (primary) hypertension: Secondary | ICD-10-CM | POA: Diagnosis present

## 2022-08-24 DIAGNOSIS — E663 Overweight: Secondary | ICD-10-CM | POA: Diagnosis present

## 2022-08-24 DIAGNOSIS — E785 Hyperlipidemia, unspecified: Secondary | ICD-10-CM | POA: Diagnosis present

## 2022-08-24 LAB — CBC WITH DIFFERENTIAL/PLATELET
Abs Immature Granulocytes: 0.04 10*3/uL (ref 0.00–0.07)
Basophils Absolute: 0 10*3/uL (ref 0.0–0.1)
Basophils Relative: 0 %
Eosinophils Absolute: 0.1 10*3/uL (ref 0.0–0.5)
Eosinophils Relative: 1 %
HCT: 45.4 % (ref 39.0–52.0)
Hemoglobin: 15.1 g/dL (ref 13.0–17.0)
Immature Granulocytes: 0 %
Lymphocytes Relative: 20 %
Lymphs Abs: 2.7 10*3/uL (ref 0.7–4.0)
MCH: 32.4 pg (ref 26.0–34.0)
MCHC: 33.3 g/dL (ref 30.0–36.0)
MCV: 97.4 fL (ref 80.0–100.0)
Monocytes Absolute: 0.7 10*3/uL (ref 0.1–1.0)
Monocytes Relative: 6 %
Neutro Abs: 9.6 10*3/uL — ABNORMAL HIGH (ref 1.7–7.7)
Neutrophils Relative %: 73 %
Platelets: 250 10*3/uL (ref 150–400)
RBC: 4.66 MIL/uL (ref 4.22–5.81)
RDW: 12.2 % (ref 11.5–15.5)
WBC: 13.2 10*3/uL — ABNORMAL HIGH (ref 4.0–10.5)
nRBC: 0 % (ref 0.0–0.2)

## 2022-08-24 LAB — BASIC METABOLIC PANEL
Anion gap: 8 (ref 5–15)
BUN: 14 mg/dL (ref 8–23)
CO2: 23 mmol/L (ref 22–32)
Calcium: 8.8 mg/dL — ABNORMAL LOW (ref 8.9–10.3)
Chloride: 106 mmol/L (ref 98–111)
Creatinine, Ser: 0.85 mg/dL (ref 0.61–1.24)
GFR, Estimated: >60 mL/min (ref >60)
Glucose, Bld: 124 mg/dL — ABNORMAL HIGH (ref 70–99)
Potassium: 4 mmol/L (ref 3.5–5.1)
Sodium: 137 mmol/L (ref 135–145)

## 2022-08-24 LAB — TSH: TSH: 2.52 u[IU]/mL (ref 0.350–4.500)

## 2022-08-24 LAB — HEMOGLOBIN A1C
Hgb A1c MFr Bld: 5.9 % — ABNORMAL HIGH (ref 4.8–5.6)
Mean Plasma Glucose: 122.63 mg/dL

## 2022-08-24 MED ORDER — ENOXAPARIN SODIUM 40 MG/0.4ML IJ SOSY
40.0000 mg | PREFILLED_SYRINGE | INTRAMUSCULAR | Status: DC
  Administered 2022-08-24: 40 mg via SUBCUTANEOUS
  Filled 2022-08-24: qty 0.4

## 2022-08-24 MED ORDER — ASPIRIN 325 MG PO TABS
325.0000 mg | ORAL_TABLET | Freq: Every day | ORAL | Status: DC
  Administered 2022-08-25: 325 mg via ORAL
  Filled 2022-08-24: qty 1

## 2022-08-24 MED ORDER — LACTATED RINGERS IV SOLN: INTRAVENOUS | Status: DC

## 2022-08-24 MED ORDER — ATORVASTATIN CALCIUM 20 MG PO TABS: 40.0000 mg | ORAL_TABLET | Freq: Every day | ORAL | Status: DC

## 2022-08-24 MED ORDER — POLYETHYLENE GLYCOL 3350 17 G PO PACK: 17.0000 g | PACK | Freq: Every day | ORAL | Status: DC | PRN

## 2022-08-24 MED ORDER — ASPIRIN 81 MG PO CHEW
324.0000 mg | CHEWABLE_TABLET | Freq: Once | ORAL | Status: AC
  Administered 2022-08-24: 324 mg via ORAL
  Filled 2022-08-24: qty 4

## 2022-08-24 MED ORDER — ATORVASTATIN CALCIUM 20 MG PO TABS
40.0000 mg | ORAL_TABLET | Freq: Every day | ORAL | Status: DC
  Administered 2022-08-24: 40 mg via ORAL
  Filled 2022-08-24 (×2): qty 2

## 2022-08-24 MED ORDER — LOSARTAN POTASSIUM 25 MG PO TABS
25.0000 mg | ORAL_TABLET | Freq: Every day | ORAL | Status: DC
  Administered 2022-08-25: 25 mg via ORAL
  Filled 2022-08-24: qty 1

## 2022-08-24 MED ORDER — ACETAMINOPHEN 325 MG PO TABS: 650.0000 mg | ORAL_TABLET | Freq: Four times a day (QID) | ORAL | Status: DC | PRN

## 2022-08-24 MED ORDER — STROKE: EARLY STAGES OF RECOVERY BOOK: Freq: Once | Status: DC

## 2022-08-24 MED ORDER — ASPIRIN 300 MG RE SUPP
300.0000 mg | Freq: Every day | RECTAL | Status: DC
  Filled 2022-08-24: qty 1

## 2022-08-24 MED ORDER — METOPROLOL TARTRATE 5 MG/5ML IV SOLN: 5.0000 mg | Freq: Four times a day (QID) | INTRAVENOUS | Status: DC | PRN

## 2022-08-24 MED ORDER — HYDROCODONE-ACETAMINOPHEN 5-325 MG PO TABS: 1.0000 | ORAL_TABLET | ORAL | Status: DC | PRN

## 2022-08-24 MED ORDER — ACETAMINOPHEN 650 MG RE SUPP: 650.0000 mg | Freq: Four times a day (QID) | RECTAL | Status: DC | PRN

## 2022-08-24 NOTE — ED Notes (Signed)
Transport has arrived to take the patient upstairs

## 2022-08-24 NOTE — H&P (Addendum)
History and Physical    Patient: Travis Watson DOB: 09/21/1956 DOA: 08/24/2022 DOS: the patient was seen and examined on 08/24/2022 PCP: Margarita Mail, DO  Patient coming from: Home  Chief Complaint:  Chief Complaint  Patient presents with   Numbness   HPI: Travis Watson is a 66 y.o. left handed male with medical history significant of HLD, HTN, COPD, with on-going tobacco use who presents with 1 day h/o numbness in his lips and 1st 3 fingers of the left hand and his left toes. In ED noted to have right Thalami infarct. Given ASA. Has not had any worsening symptoms. Reports full function of hands and feet, is walking and talking normally.  Review of Systems: As mentioned in the history of present illness. All other systems reviewed and are negative. Past Medical History:  Diagnosis Date   Hyperlipidemia    Hypertension    Paraseptal emphysema (HCC) 08/13/2018   History reviewed. No pertinent surgical history. Social History:  reports that he has been smoking cigarettes. He has a 49.00 pack-year smoking history. He has quit using smokeless tobacco.  His smokeless tobacco use included snuff. He reports current alcohol use. He reports that he does not use drugs.  No Known Allergies  Family History  Problem Relation Age of Onset   Diabetes Mother    Emphysema Mother    Hyperlipidemia Father    Cancer Father        prostate   Heart disease Maternal Grandmother    Hypertension Maternal Grandmother    Heart disease Maternal Grandfather    Hypertension Maternal Grandfather    Migraines Paternal Grandmother     Prior to Admission medications   Medication Sig Start Date End Date Taking? Authorizing Provider  atorvastatin (LIPITOR) 40 MG tablet TAKE 1 TABLET BY MOUTH EVERYDAY AT BEDTIME Patient taking differently: Take 40 mg by mouth daily. TAKE 1 TABLET BY MOUTH EVERYDAY AT BEDTIME 04/07/22  Yes Margarita Mail, DO  losartan (COZAAR) 25 MG  tablet TAKE 1 TABLET (25 MG TOTAL) BY MOUTH DAILY. 02/13/22  Yes Margarita Mail, DO  fluocinonide cream (LIDEX) 0.05 % APPLY ON THE SKIN TWICE A DAY AS NEEDED 10/06/18   [provider]    Physical Exam: Vitals:   08/24/22 1200 08/24/22 1215 08/24/22 1400 08/24/22 1405  BP: (!) 159/79 136/74  136/74  Pulse: 96 72 62 (!) 58  Resp: 20 17 (!) 22 20  Temp:      TempSrc:      SpO2: 99% 98% 98% 99%  Weight:       Physical Examination: General appearance - alert, well appearing, and in no distress Mental status - alert, oriented to person, place, and time Chest - clear to auscultation, no wheezes, rales or rhonchi, symmetric air entry Abdomen - soft, nontender, nondistended, no masses or organomegaly Neurological - alert, oriented, normal speech, no focal findings or movement disorder noted Extremities - peripheral pulses normal, no pedal edema, no clubbing or cyanosis Skin - normal coloration and turgor, no rashes, no suspicious skin lesions noted  Data Reviewed: Results for orders placed or performed during the hospital encounter of 08/24/22 (from the past 24 hour(s))  Basic metabolic panel     Status: Abnormal   Collection Time: 08/24/22 11:24 AM  Result Value Ref Range   Sodium 137 135 - 145 mmol/L   Potassium 4.0 3.5 - 5.1 mmol/L   Chloride 106 98 - 111 mmol/L   CO2 23 22 -  32 mmol/L   Glucose, Bld 124 (H) 70 - 99 mg/dL   BUN 14 8 - 23 mg/dL   Creatinine, Ser 4.74 0.61 - 1.24 mg/dL   Calcium 8.8 (L) 8.9 - 10.3 mg/dL   GFR, Estimated >25 >95 mL/min   Anion gap 8 5 - 15  CBC with Differential     Status: Abnormal   Collection Time: 08/24/22 11:24 AM  Result Value Ref Range   WBC 13.2 (H) 4.0 - 10.5 K/uL   RBC 4.66 4.22 - 5.81 MIL/uL   Hemoglobin 15.1 13.0 - 17.0 g/dL   HCT 63.8 75.6 - 43.3 %   MCV 97.4 80.0 - 100.0 fL   MCH 32.4 26.0 - 34.0 pg   MCHC 33.3 30.0 - 36.0 g/dL   RDW 29.5 18.8 - 41.6 %   Platelets 250 150 - 400 K/uL   nRBC 0.0 0.0 - 0.2 %    Neutrophils Relative % 73 %   Neutro Abs 9.6 (H) 1.7 - 7.7 K/uL   Lymphocytes Relative 20 %   Lymphs Abs 2.7 0.7 - 4.0 K/uL   Monocytes Relative 6 %   Monocytes Absolute 0.7 0.1 - 1.0 K/uL   Eosinophils Relative 1 %   Eosinophils Absolute 0.1 0.0 - 0.5 K/uL   Basophils Relative 0 %   Basophils Absolute 0.0 0.0 - 0.1 K/uL   Immature Granulocytes 0 %   Abs Immature Granulocytes 0.04 0.00 - 0.07 K/uL   MR BRAIN WO CONTRAST  Result Date: 08/24/2022 CLINICAL DATA:  Neuro deficit, acute, stroke suspected. Numbness involving the face, left hand, and left foot. EXAM: MRI HEAD WITHOUT CONTRAST TECHNIQUE: Multiplanar, multiecho pulse sequences of the brain and surrounding structures were obtained without intravenous contrast. COMPARISON:  Head CT 08/24/2022. FINDINGS: Brain: There is an acute 1 cm infarct in the lateral aspect of the right thalamus. Patchy T2 hyperintensities throughout the cerebral white matter and pons are nonspecific but compatible with moderate chronic small vessel ischemic disease. A chronic lacunar infarct is noted in the left putamen. The ventricles and sulci are within normal limits for age. No intracranial hemorrhage, mass, midline shift, or extra-axial fluid collection is identified. Vascular: Major intracranial vascular flow voids are preserved. Skull and upper cervical spine: Unremarkable bone marrow signal. Sinuses/Orbits: Unremarkable orbits. Mild mucosal thickening in the frontal and ethmoid sinuses. Trace bilateral mastoid fluid. Other: None. IMPRESSION: 1. Acute right thalamic infarct. 2. Moderate chronic small vessel ischemic disease. Electronically Signed   By: Sebastian Ache M.D.   On: 08/24/2022 13:55   CT HEAD WO CONTRAST ( )  Result Date: 08/24/2022 CLINICAL DATA:  66 year old male with left side numbness and tingling since yesterday. EXAM: CT HEAD WITHOUT CONTRAST TECHNIQUE: Contiguous axial images were obtained from the base of the skull through the vertex without  intravenous contrast. RADIATION DOSE REDUCTION: This exam was performed according to the departmental dose-optimization program which includes automated exposure control, adjustment of the mA and/or kV according to patient size and/or use of iterative reconstruction technique. COMPARISON:  None Available. FINDINGS: Brain: Age indeterminate small lacunar infarct in the lateral right thalamus near the posterior limb right internal capsule on series 2, image 14. Patchy mild to moderate additional scattered bilateral cerebral white matter hypodensity. Cerebral volume is within normal limits for age. Cavum septum pellucidum, normal variant. No midline shift, ventriculomegaly, mass effect, evidence of mass lesion, intracranial hemorrhage or evidence of cortically based acute infarction. Vascular: No suspicious intracranial vascular hyperdensity. Skull: No acute osseous abnormality identified.  Sinuses/Orbits: Minor mostly ethmoid paranasal sinus mucosal thickening. No sinus fluid levels. Tympanic cavities and mastoids are clear. Other: No acute orbit or scalp soft tissue finding. IMPRESSION: 1. Age indeterminate lacunar infarct in the lateral right thalamus, suspicious for Acute in this setting. No hemorrhage or mass effect. 2. Other mild to moderate for age bilateral cerebral white matter changes, most likely small vessel disease related. Electronically Signed   By: Odessa Fleming M.D.   On: 08/24/2022 12:43    EKG without acute changes  Assessment and Plan: * Acute CVA (cerebrovascular accident) (HCC) Tele Carotid artery dopplers ECHO ASA Neuro consult PT/OT/SLP referral   Paraseptal emphysema (HCC) No symptoms. On no meds Tobacco cessation  Tobacco abuse counseling Offered and declined nicotine replacement Smoking cessation recommended  Hyperlipidemia Continue Lipitor  Hypertension Continue Cozaar      Advance Care Planning:   Code Status: Not on file Full  Consults: Neuro  Family  Communication: patient at bedside  Severity of Illness: The appropriate patient status for this patient is INPATIENT. Inpatient status is judged to be reasonable and necessary in order to provide the required intensity of service to ensure the patient's safety. The patient's presenting symptoms, physical exam findings, and initial radiographic and laboratory data in the context of their chronic comorbidities is felt to place them at high risk for further clinical deterioration. Furthermore, it is not anticipated that the patient will be medically stable for discharge from the hospital within 2 midnights of admission.   * I certify that at the point of admission it is my clinical judgment that the patient will require inpatient hospital care spanning beyond 2 midnights from the point of admission due to high intensity of service, high risk for further deterioration and high frequency of surveillance required.*  Author: Reva Bores, MD 08/24/2022 4:00 PM  For on call review [email or web address].

## 2022-08-24 NOTE — ED Notes (Signed)
Drew and sent red, blue, lt green, lav tubes.

## 2022-08-24 NOTE — ED Triage Notes (Signed)
Pt here for numbness/tingling to part of left face, left hand and left foot. Pt reports left leg does not feel right but it is not weak. No facial droop or weakness noted. Ambulatory with steady gait.  + smoker with HTN.  Speech clear.  Pt unsure exactly what time sx started but he thinks about 1200 yesterday afternoon.  VAN negative.

## 2022-08-24 NOTE — Assessment & Plan Note (Addendum)
Offered and declined nicotine replacement Smoking cessation recommended.  Explained to patient that his long-term smoking is probably the biggest contributing factor to his stroke.  He understands and is motivated to quit now.

## 2022-08-24 NOTE — ED Notes (Signed)
Pt resting in bed, no needs expressed at this time.

## 2022-08-24 NOTE — Assessment & Plan Note (Addendum)
Previously on Lipitor 40.  LDL at 87 and HDL 28 with target LDL below 70 now that he has had a CVA.  Increase Lipitor to 80 mg p.o. daily.

## 2022-08-24 NOTE — Assessment & Plan Note (Signed)
Continue Cozaar.  Blood pressure well controlled overall, allowing for some permissive hypertension over the next few days

## 2022-08-24 NOTE — ED Notes (Signed)
Pt going to MRI now

## 2022-08-24 NOTE — ED Provider Notes (Signed)
[facility] Provider Note    Event Date/Time   First MD Initiated Contact with Patient 08/24/22 1105     (approximate)   History   Numbness   HPI  Travis Watson is a 66 y.o. male  with pmh HTN, HLD, emphysema who presents with numbness.  Patient endorses numbness on the left upper lip first 3 digits and occasional numbness in the left toes since yesterday.  The toe and hand numbness has been coming and going.  Denies any visual changes difficulty speaking or weakness.  No history of stroke.  Denies any other symptoms including chest pain shortness of breath.  No recent illnesses.  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 --      Excl. in GC? --     Most recent vital signs: Vitals:   08/24/22 1059  BP: (!) 167/100  Pulse: 98  Resp: 16  Temp: 98.1 F (36.7 C)  SpO2: 97%     General: Awake, no distress.  CV:  Good peripheral perfusion.  Resp:  Normal  effort.  Abd:  No distention.  Neuro:             Awake, Alert, Oriented x 3  Other:  Aox3, nml speech  PERRL, EOMI, face symmetric, nml tongue movement, facial sensation is intact V1 V2 and V3 distributions 5/5 strength in the BL upper and lower extremities  Sensation grossly intact in the BL upper and lower extremities  Finger-nose-finger intact BL    ED Results / Procedures / Treatments  Labs (all labs ordered are listed, but only abnormal results are displayed) Labs Reviewed  BASIC METABOLIC PANEL - Abnormal; Notable for the following components:      Result Value   Glucose, Bld 124 (*)    Calcium 8.8 (*)    All other components within normal limits  CBC WITH DIFFERENTIAL/PLATELET - Abnormal; Notable for the following components:   WBC 13.2 (*)    Neutro Abs 9.6 (*)    All other components within normal limits     EKG  EKG interpretation performed by myself: NSR, nml axis, nml intervals, no acute ischemic changes  RADIOLOGY I reviewed and interpreted the CT scan of the brain which does not show a bleed or obvious mass   PROCEDURES:  Critical Care performed: No  .1-3 Lead EKG Interpretation  Performed by: Georga Hacking, MD Authorized by: Georga Hacking, MD     Interpretation: normal     ECG rate assessment: normal     Rhythm: sinus rhythm     Ectopy: none     Conduction: normal     The patient is on the cardiac monitor to evaluate for evidence of arrhythmia and/or significant heart rate changes.   MEDICATIONS ORDERED IN ED: Medications  aspirin chewable tablet 324 mg (has no administration in time range)     IMPRESSION / MDM / ASSESSMENT AND PLAN / ED COURSE  I reviewed the triage vital signs and the nursing notes.                              Patient's presentation is most consistent with acute complicated illness / injury requiring diagnostic workup.  Differential diagnosis includes, but is not limited to, peripheral neuropathy,  electrolyte abnormality, hypoglycemia, CVA, TIA, brain mass  Patient is a 66 year old male who presents with scattered numbness in the left side of his body.  This started sometime yesterday.  He points to the left side of the upper lip but not the left face and the first through third digits of the left hand but not the left arm.  There is occasional intermittent left toe numbness but not of the foot or leg.  He has no weakness visual change or difficulty speaking.  His exam essentially is normal even his sensory exam is intact.  Think it is very unlikely that this is a CVA given the patchy distribution.  Would expect numbness to affect the entire face or eyes the entire hand or arm.  Unclear exactly why it is all left-sided but overall I am reassured.  Will obtain CT head just to screen for obvious intracranial mass which could cause some atypical sensory symptoms.  If this negative we will have him follow-up with his primary care.    CT head interestingly shows a age-indeterminate infarct in the right lateral thalamus which would explain patient's symptoms.  I will obtain an MRI and given 325 of aspirin.   FINAL CLINICAL IMPRESSION(S) / ED DIAGNOSES   Final diagnoses:  None     Rx / DC Orders   ED Discharge Orders     None        Note:  This document was prepared using Dragon voice recognition software and may include unintentional dictation errors.   Georga Hacking, MD 08/24/22 1258

## 2022-08-24 NOTE — Assessment & Plan Note (Signed)
No symptoms. On no meds Tobacco cessation

## 2022-08-24 NOTE — Assessment & Plan Note (Signed)
Tele Carotid artery dopplers ECHO ASA Neuro consult PT/OT/SLP referral

## 2022-08-24 NOTE — ED Notes (Signed)
Pt to ED POV for subtle tingling and numbness to L thumb, index and middle fingers since 23 hours ago. Denies motor weakness, HA, vision or speech changes.

## 2022-08-25 ENCOUNTER — Inpatient Hospital Stay: Payer: Medicare Other

## 2022-08-25 ENCOUNTER — Observation Stay (HOSPITAL_BASED_OUTPATIENT_CLINIC_OR_DEPARTMENT_OTHER)
Admit: 2022-08-25 | Discharge: 2022-08-25 | Disposition: A | Discharge status: Home / Self Care | Payer: Medicare Other | Attending: Family Medicine | Admitting: Family Medicine

## 2022-08-25 DIAGNOSIS — I361 Nonrheumatic tricuspid (valve) insufficiency: Secondary | ICD-10-CM | POA: Diagnosis not present

## 2022-08-25 DIAGNOSIS — I639 Cerebral infarction, unspecified: Secondary | ICD-10-CM | POA: Diagnosis present

## 2022-08-25 DIAGNOSIS — I5032 Chronic diastolic (congestive) heart failure: Secondary | ICD-10-CM

## 2022-08-25 DIAGNOSIS — E663 Overweight: Secondary | ICD-10-CM | POA: Diagnosis not present

## 2022-08-25 DIAGNOSIS — E782 Mixed hyperlipidemia: Secondary | ICD-10-CM | POA: Diagnosis not present

## 2022-08-25 LAB — ECHOCARDIOGRAM COMPLETE
AR max vel: 2.18 cm2
AV Area VTI: 2.26 cm2
AV Area mean vel: 2.11 cm2
AV Mean grad: 7 mmHg
AV Peak grad: 15.8 mmHg
Ao pk vel: 1.99 m/s
Area-P 1/2: 4.19 cm2
S' Lateral: 3.17 cm
Single Plane A4C EF: 63.7 %
Weight: 2871.27 oz

## 2022-08-25 LAB — LIPID PANEL
Cholesterol: 131 mg/dL (ref 0–200)
HDL: 28 mg/dL — ABNORMAL LOW (ref >40)
LDL Cholesterol: 87 mg/dL (ref 0–99)
Total CHOL/HDL Ratio: 4.7 RATIO
Triglycerides: 79 mg/dL (ref <150)
VLDL: 16 mg/dL (ref 0–40)

## 2022-08-25 LAB — HIV ANTIBODY (ROUTINE TESTING W REFLEX): HIV Screen 4th Generation wRfx: NONREACTIVE

## 2022-08-25 MED ORDER — ASPIRIN 325 MG PO TABS: 325.0000 mg | ORAL_TABLET | Freq: Every day | ORAL | 11 refills | Status: DC

## 2022-08-25 MED ORDER — NICOTINE 14 MG/24HR TD PT24: 14.0000 mg | MEDICATED_PATCH | TRANSDERMAL | 0 refills | Status: AC

## 2022-08-25 MED ORDER — ATORVASTATIN CALCIUM 80 MG PO TABS: 40.0000 mg | ORAL_TABLET | Freq: Every day | ORAL | 11 refills | Status: DC

## 2022-08-25 NOTE — Assessment & Plan Note (Signed)
Meets criteria with BMI greater than 25 

## 2022-08-25 NOTE — Discharge Summary (Signed)
Physician Discharge Summary   Patient: Travis Watson MRN: [id number] DOB: 1956-05-27  Admit date:     08/24/2022  Discharge date: 08/26/22  Discharge Physician: Hollice Espy   PCP: Margarita Mail, DO   Recommendations at discharge:   New medication: Nicotine patch 14 mg topically daily New medication: Aspirin 325 p.o. daily Medication change: Lipitor increased from 40 mg to 80 mg daily Patient will follow-up with neurology in the next few months  Discharge Diagnoses: Principal Problem:   Acute CVA (cerebrovascular accident) [facility]) Active Problems:   Chronic diastolic CHF (congestive heart failure) (HCC)   Paraseptal emphysema (HCC)   Prediabetes   Hypertension   Tobacco abuse counseling   Hyperlipidemia   Overweight (BMI 25.0-29.9)   CVA (cerebral vascular accident) [facility])  Resolved Problems:   * No resolved hospital problems. *  Hospital Course: 66 year old male with past medical history of hypertension, COPD and hyperlipidemia plus tobacco abuse presented to the emergency room on 9/3 with complaints of 1 day of numbness on his lips and numbness of his first 3 fingers of left hand and toes.  In emergency room, work-up revealed a right thalamus infarct.  Patient brought in for further stroke work-up and started on aspirin.  Assessment and Plan: * Acute CVA (cerebrovascular accident) (HCC) Started on daily aspirin.  Carotid Doppler results pending and echocardiogram to be done.  Looks to be small vessel disease.  Increased statin.  A1c at 5.9 and advised patient and he is aware of his prediabetic regimen condition.  Cleared by PT and OT and speech.  Outpatient follow-up with neurology in a few months.  Had extensive discussion about quitting smoking.  Nicotine patch provided.  Echocardiogram unrevealing and carotid Dopplers noted intimal thickening at right carotid bulb, but no signs of significant stenosis.   Chronic diastolic CHF (congestive heart failure)  (HCC) Incidentally noted on echocardiogram.  Patient euvolemic.  Already on ARB.  Paraseptal emphysema (HCC) No symptoms. On no meds Tobacco cessation  Prediabetes Stable, A1c at 5.9.  Hypertension Continue Cozaar.  Blood pressure well controlled overall, allowing for some permissive hypertension over the next few days  Tobacco abuse counseling Offered and declined nicotine replacement Smoking cessation recommended.  Explained to patient that his long-term smoking is probably the biggest contributing factor to his stroke.  He understands and is motivated to quit now.  Hyperlipidemia Previously on Lipitor 40.  LDL at 87 and HDL 28 with target LDL below 70 now that he has had a CVA.  Increase Lipitor to 80 mg p.o. daily.  Overweight (BMI 25.0-29.9) Meets criteria with BMI greater than 25         Consultants: Neurology Procedures performed:  -Echocardiogram unremarkable, only noting grade 1 diastolic dysfunction. -Carotid Dopplers: No signs of significant stenosis.  Some intimal thickening at right carotid bulb  Disposition: Home Diet recommendation:  Discharge Diet Orders (From admission, onward)     Start     Ordered   08/25/22 0000  Diet - low sodium heart healthy        08/25/22 1309           Cardiac diet DISCHARGE MEDICATION: Allergies as of 08/25/2022   No Known Allergies      Medication List     STOP taking these medications    atorvastatin 40 MG tablet Commonly known as: LIPITOR       TAKE these medications    aspirin 325 MG tablet Take 1 tablet (325 mg total) by  mouth daily.   fluocinonide cream 0.05 % Commonly known as: LIDEX APPLY ON THE SKIN TWICE A DAY AS NEEDED   losartan 25 MG tablet Commonly known as: COZAAR TAKE 1 TABLET (25 MG TOTAL) BY MOUTH DAILY.   nicotine 14 mg/24hr patch Commonly known as: NICODERM CQ - dosed in mg/24 hours Place 1 patch (14 mg total) onto the skin daily.        Follow-up Information     Lonell Face, MD. Schedule an appointment as soon as possible for a visit in 6 week(s).   Specialty: Neurology Contact information: [address] [facility] [place] [address] [phone]                Discharge Exam: Ceasar Mons Weights   08/24/22 1100  Weight: 81.4 kg   General: Alert and oriented x3, no acute distress Cardiovascular: Regular rate and rhythm, S1-S2 Lungs: Clear to auscultation bilaterally Neuro: No focal deficits  Condition at discharge: good  The results of significant diagnostics from this hospitalization (including imaging, microbiology, ancillary and laboratory) are listed below for reference.   Imaging Studies: US Carotid Bilateral (at [facility] and [facility] only)  Result Date: 08/26/2022 CLINICAL DATA:  Neuro deficit.  Right thalamic infarct on MRI. EXAM: BILATERAL CAROTID DUPLEX ULTRASOUND TECHNIQUE: Wallace Cullens scale imaging, color Doppler and duplex ultrasound were performed of bilateral carotid and vertebral arteries in the neck. COMPARISON:  MRI head 08/24/2022 FINDINGS: Criteria: Quantification of carotid stenosis is based on velocity parameters that correlate the residual internal carotid diameter with NASCET-based stenosis levels, using the diameter of the distal internal carotid lumen as the denominator for stenosis measurement. The following velocity measurements were obtained: RIGHT ICA: 72/21 cm/sec CCA: 105/15 cm/sec SYSTOLIC ICA/CCA RATIO:  0.7 ECA: 92 cm/sec LEFT ICA: 77/21 cm/sec CCA: 77/16 cm/sec SYSTOLIC ICA/CCA RATIO:  1.0 ECA: 83 cm/sec RIGHT CAROTID ARTERY: Intimal thickening at the right carotid bulb. External carotid artery is patent with normal waveform. Intimal thickening in the proximal internal carotid artery. Normal waveforms and velocities in the internal carotid artery. RIGHT VERTEBRAL ARTERY: Antegrade flow and normal waveform in the right vertebral artery. LEFT CAROTID ARTERY: Intimal thickening at the left carotid bulb.  External carotid artery is patent with normal waveform. Normal waveforms and velocities in the internal carotid artery. LEFT VERTEBRAL ARTERY: Antegrade flow and normal waveform in the left vertebral artery. IMPRESSION: 1. Intimal thickening at the carotid bulbs and proximal internal carotid arteries bilaterally. No significant carotid artery stenosis. 2. Patent vertebral arteries with antegrade flow. Electronically Signed   By: Richarda Overlie M.D.   On: 08/26/2022 08:39   ECHOCARDIOGRAM COMPLETE  Result Date: 08/25/2022    ECHOCARDIOGRAM REPORT   Patient Name:   MASSON NALEPA Watson Date of Exam: 08/25/2022 Medical Rec #:  [id number]               Height:       69.0 in Accession #:    [id number]              Weight:       179.5 lb Date of Birth:  10-27-1956               BSA:          1.973 m Patient Age:    66 years                BP:           145/67 mmHg Patient  Gender: M                       HR:           60 bpm. Exam Location:  [facility] Procedure: 2D Echo Indications:     Stroke I63.9  History:         Patient has no prior history of Echocardiogram examinations.  Sonographer:     Overton Mam RDCS Referring Phys:  [id number] Shelbie Proctor PRATT Diagnosing Phys: Julien Nordmann MD IMPRESSIONS  1. Left ventricular ejection fraction, by estimation, is 55 to 60%. The left ventricle has normal function. The left ventricle has no regional wall motion abnormalities. Left ventricular diastolic parameters are consistent with Grade I diastolic dysfunction (impaired relaxation).  2. Right ventricular systolic function is normal. The right ventricular size is normal. There is mildly elevated pulmonary artery systolic pressure. The estimated right ventricular systolic pressure is 32.5 mmHg.  3. The mitral valve is normal in structure. No evidence of mitral valve regurgitation. No evidence of mitral stenosis.  4. The aortic valve is normal in structure. Aortic valve regurgitation is not visualized. Aortic valve sclerosis/calcification  is present, without any evidence of aortic stenosis.  5. The inferior vena cava is normal in size with greater than 50% respiratory variability, suggesting right atrial pressure of 3 mmHg. FINDINGS  Left Ventricle: Left ventricular ejection fraction, by estimation, is 55 to 60%. The left ventricle has normal function. The left ventricle has no regional wall motion abnormalities. The left ventricular internal cavity size was normal in size. There is  no left ventricular hypertrophy. Left ventricular diastolic parameters are consistent with Grade I diastolic dysfunction (impaired relaxation). Right Ventricle: The right ventricular size is normal. No increase in right ventricular wall thickness. Right ventricular systolic function is normal. There is mildly elevated pulmonary artery systolic pressure. The tricuspid regurgitant velocity is 2.62  m/s, and with an assumed right atrial pressure of 5 mmHg, the estimated right ventricular systolic pressure is 32.5 mmHg. Left Atrium: Left atrial size was normal in size. Right Atrium: Right atrial size was normal in size. Pericardium: There is no evidence of pericardial effusion. Mitral Valve: The mitral valve is normal in structure. No evidence of mitral valve regurgitation. No evidence of mitral valve stenosis. Tricuspid Valve: The tricuspid valve is normal in structure. Tricuspid valve regurgitation is mild . No evidence of tricuspid stenosis. Aortic Valve: The aortic valve is normal in structure. Aortic valve regurgitation is not visualized. Aortic valve sclerosis/calcification is present, without any evidence of aortic stenosis. Aortic valve mean gradient measures 7.0 mmHg. Aortic valve peak  gradient measures 15.8 mmHg. Aortic valve area, by VTI measures 2.26 cm. Pulmonic Valve: The pulmonic valve was normal in structure. Pulmonic valve regurgitation is not visualized. No evidence of pulmonic stenosis. Aorta: The aortic root is normal in size and structure. Venous: The  inferior vena cava is normal in size with greater than 50% respiratory variability, suggesting right atrial pressure of 3 mmHg. IAS/Shunts: No atrial level shunt detected by color flow Doppler.  LEFT VENTRICLE PLAX 2D LVIDd:         4.60 cm     Diastology LVIDs:         3.17 cm     LV e' medial:    10.20 cm/s LV PW:         0.97 cm     LV E/e' medial:  10.0 LV IVS:  1.00 cm     LV e' lateral:   13.10 cm/s LVOT diam:     2.10 cm     LV E/e' lateral: 7.8 LV SV:         94 LV SV Index:   48 LVOT Area:     3.46 cm  LV Volumes (MOD) LV vol d, MOD A4C: 80.8 ml LV vol s, MOD A4C: 29.3 ml LV SV MOD A4C:     80.8 ml RIGHT VENTRICLE RV Basal diam:  3.49 cm RV S prime:     18.30 cm/s TAPSE (M-mode): 2.6 cm LEFT ATRIUM             Index        RIGHT ATRIUM           Index LA diam:        4.00 cm 2.03 cm/m   RA Area:     19.10 cm LA Vol (A2C):   56.3 ml 28.54 ml/m  RA Volume:   56.80 ml  28.79 ml/m LA Vol (A4C):   38.4 ml 19.46 ml/m LA Biplane Vol: 47.6 ml 24.13 ml/m  AORTIC VALVE                     PULMONIC VALVE AV Area (Vmax):    2.18 cm      PV Vmax:       1.11 m/s AV Area (Vmean):   2.11 cm      PV Peak grad:  4.9 mmHg AV Area (VTI):     2.26 cm AV Vmax:           198.50 cm/s AV Vmean:          123.000 cm/s AV VTI:            0.416 m AV Peak Grad:      15.8 mmHg AV Mean Grad:      7.0 mmHg LVOT Vmax:         125.00 cm/s LVOT Vmean:        75.100 cm/s LVOT VTI:          0.271 m LVOT/AV VTI ratio: 0.65  AORTA Ao Root diam: 2.90 cm Ao Asc diam:  3.10 cm MITRAL VALVE                TRICUSPID VALVE MV Area (PHT): 4.19 cm     TV Peak grad:   29.3 mmHg MV Decel Time: 181 msec     TV Vmax:        2.70 m/s MV E velocity: 102.00 cm/s  TR Peak grad:   27.5 mmHg MV A velocity: 67.70 cm/s   TR Vmax:        262.00 cm/s MV E/A ratio:  1.51                             SHUNTS                             Systemic VTI:  0.27 m                             Systemic Diam: 2.10 cm Julien Nordmann MD Electronically signed by  Julien Nordmann MD Signature Date/Time: 08/25/2022/4:38:56 PM    Final    MR BRAIN WO CONTRAST  Result Date: 08/24/2022  CLINICAL DATA:  Neuro deficit, acute, stroke suspected. Numbness involving the face, left hand, and left foot. EXAM: MRI HEAD WITHOUT CONTRAST TECHNIQUE: Multiplanar, multiecho pulse sequences of the brain and surrounding structures were obtained without intravenous contrast. COMPARISON:  Head CT 08/24/2022. FINDINGS: Brain: There is an acute 1 cm infarct in the lateral aspect of the right thalamus. Patchy T2 hyperintensities throughout the cerebral white matter and pons are nonspecific but compatible with moderate chronic small vessel ischemic disease. A chronic lacunar infarct is noted in the left putamen. The ventricles and sulci are within normal limits for age. No intracranial hemorrhage, mass, midline shift, or extra-axial fluid collection is identified. Vascular: Major intracranial vascular flow voids are preserved. Skull and upper cervical spine: Unremarkable bone marrow signal. Sinuses/Orbits: Unremarkable orbits. Mild mucosal thickening in the frontal and ethmoid sinuses. Trace bilateral mastoid fluid. Other: None. IMPRESSION: 1. Acute right thalamic infarct. 2. Moderate chronic small vessel ischemic disease. Electronically Signed   By: Sebastian AcheAllen  Grady M.D.   On: 08/24/2022 13:55   CT HEAD WO CONTRAST (5MM)  Result Date: 08/24/2022 CLINICAL DATA:  66 year old male with left side numbness and tingling since yesterday. EXAM: CT HEAD WITHOUT CONTRAST TECHNIQUE: Contiguous axial images were obtained from the base of the skull through the vertex without intravenous contrast. RADIATION DOSE REDUCTION: This exam was performed according to the departmental dose-optimization program which includes automated exposure control, adjustment of the mA and/or kV according to patient size and/or use of iterative reconstruction technique. COMPARISON:  None Available. FINDINGS: Brain: Age indeterminate  small lacunar infarct in the lateral right thalamus near the posterior limb right internal capsule on series 2, image 14. Patchy mild to moderate additional scattered bilateral cerebral white matter hypodensity. Cerebral volume is within normal limits for age. Cavum septum pellucidum, normal variant. No midline shift, ventriculomegaly, mass effect, evidence of mass lesion, intracranial hemorrhage or evidence of cortically based acute infarction. Vascular: No suspicious intracranial vascular hyperdensity. Skull: No acute osseous abnormality identified. Sinuses/Orbits: Minor mostly ethmoid paranasal sinus mucosal thickening. No sinus fluid levels. Tympanic cavities and mastoids are clear. Other: No acute orbit or scalp soft tissue finding. IMPRESSION: 1. Age indeterminate lacunar infarct in the lateral right thalamus, suspicious for Acute in this setting. No hemorrhage or mass effect. 2. Other mild to moderate for age bilateral cerebral white matter changes, most likely small vessel disease related. Electronically Signed   By: Odessa FlemingH  Hall M.D.   On: 08/24/2022 12:43    Microbiology: No results found for this or any previous visit.  Labs: CBC: Recent Labs  Lab 08/24/22 1124  WBC 13.2*  NEUTROABS 9.6*  HGB 15.1  HCT 45.4  MCV 97.4  PLT 250   Basic Metabolic Panel: Recent Labs  Lab 08/24/22 1124  NA 137  K 4.0  CL 106  CO2 23  GLUCOSE 124*  BUN 14  CREATININE 0.85  CALCIUM 8.8*   Liver Function Tests: No results for input(s): "AST", "ALT", "ALKPHOS", "BILITOT", "PROT", "ALBUMIN" in the last 168 hours. CBG: No results for input(s): "GLUCAP" in the last 168 hours.  Discharge time spent: less than 30 minutes.  Signed: Hollice EspySendil K Azalia Neuberger, MD [facility] 08/26/2022

## 2022-08-25 NOTE — Care Management CC44 (Signed)
Condition Code 44 Documentation Completed  Patient Details  Name: Travis Watson MRN: [id number] Date of Birth: 01-Jan-1956   Condition Code 44 given:  Yes Patient signature on Condition Code 44 notice:  Yes Documentation of 2 MD's agreement:  Yes Code 44 added to claim:  Yes    Truddie Hidden, RN 08/25/2022, 2:25 PM

## 2022-08-25 NOTE — Assessment & Plan Note (Signed)
Incidentally noted on echocardiogram.  Patient euvolemic.  Already on ARB.

## 2022-08-25 NOTE — Progress Notes (Signed)
SLP Cancellation Note  Patient Details Name: Travis Watson MRN: [id number] DOB: April 19, 1956   Cancelled treatment:       Reason Eval/Treat Not Completed: SLP screened, no needs identified, will sign off (chart reviewed; consulted NSG then met w/ pt) Pt sipping liquids upon entering room. Pt denied any difficulty swallowing and is currently on a regular diet; tolerates swallowing pills w/ water per NSG. Pt conversed in full conversation w/out expressive/receptive deficits noted; pt denied any speech-language deficits. Speech clear; he denied any numbness or tingling in mouth; only min in fingers. No further skilled ST services indicated as pt appears at his baseline. Pt agreed. NSG to reconsult if any change in status while admitted.       Orinda Kenner, MS, CCC-SLP Speech Language Pathologist Rehab Services; [place] [phone] (ascom) Makaiah Terwilliger 08/25/2022, 9:22 AM

## 2022-08-25 NOTE — Progress Notes (Signed)
OT Cancellation Note  Patient Details Name: Travis Watson MRN: [id number] DOB: 1956/10/12   Cancelled Treatment:    Reason Eval/Treat Not Completed: OT screened, no needs identified, will sign off. OT orders received, chart reviewed. Patient in bathroom upon arrival and complete toileting, peri care, and clothing management independently. Pt wears glasses, denied any acute vision changes. Pt reports only residual symptom is numbness in L fingers that "comes and goes". No skilled needs at this time. OT to sign off.  Dorene Grebe  [facility] 08/25/2022, 12:28 PM

## 2022-08-25 NOTE — Evaluation (Signed)
Physical Therapy Evaluation Patient Details Name: Travis Watson MRN: [id number] DOB: 1956/08/01 Today's Date: 08/25/2022  History of Present Illness  Travis Watson III is a 66 y.o. left handed male with medical history significant of HLD, HTN, COPD, with on-going tobacco use who presents with 1 day h/o numbness in his lips and 1st 3 fingers of the left hand and his left toes.  In ED noted to have right Thalami infarct. Given ASA. Has not had any worsening symptoms. Reports full function of hands and feet, is walking and talking normally.  Clinical Impression  66 yo Male presents to ED with numbness/tingling in lips, left and and left foot. He was independent prior to admittance. Patient was diagnosed with acute right thalamic infarct. He exhibits full strength in BLE. He reports most symptoms have resolved however he does still have intermittent numbness in left fingers and toes. He currently is ambulating independently. He also exhibits normal balance being able to hold SLS for 10 sec and tandem stance with eyes closed for 10 sec; No skilled PT needs identified at this time. Will DC in house.      Recommendations for follow up therapy are one component of a multi-disciplinary discharge planning process, led by the attending physician.  Recommendations may be updated based on patient status, additional functional criteria and insurance authorization.  Follow Up Recommendations No PT follow up      Assistance Recommended at Discharge None  Patient can return home with the following       Equipment Recommendations None recommended by PT  Recommendations for Other Services       Functional Status Assessment Patient has not had a recent decline in their functional status     Precautions / Restrictions Precautions Precautions: None Restrictions Weight Bearing Restrictions: No      Mobility  Bed Mobility               General bed mobility comments: not observed,  in chair at start of PT evaluation;    Transfers Overall transfer level: Independent                      Ambulation/Gait Ambulation/Gait assistance: Independent Gait Distance (Feet): 185 Feet   Gait Pattern/deviations: WFL(Within Functional Limits) Gait velocity: 10 feet walk speed: 2.62 feet/sec Gait velocity interpretation: >2.62 ft/sec, indicative of community ambulatory   General Gait Details: Pt ambulated pushing IV pole but exhibits good gait pattern and does not exhibit need for AD  Stairs            Wheelchair Mobility    Modified Rankin (Stroke Patients Only)       Balance Overall balance assessment: Independent                               Standardized Balance Assessment Standardized Balance Assessment :  (Able to hold SLS >10 sec; able to hold tandem stance for 10 sec with eyes closed)           Pertinent Vitals/Pain Pain Assessment Pain Assessment: No/denies pain    Home Living Family/patient expects to be discharged to:: Private residence Living Arrangements: Alone   Type of Home: House Home Access: Stairs to enter Entrance Stairs-Rails: Right;Left;Can reach both Entrance Stairs-Number of Steps: 5   Home Layout: One level Home Equipment: None      Prior Function Prior Level of Function : Independent/Modified Independent  Mobility Comments: was working at [employer]; no limitations;       Higher education careers adviser   Dominant Hand: Left    Extremity/Trunk Assessment   Upper Extremity Assessment Upper Extremity Assessment: Defer to OT evaluation    Lower Extremity Assessment Lower Extremity Assessment: Overall WFL for tasks assessed    Cervical / Trunk Assessment Cervical / Trunk Assessment: Normal  Communication   Communication: No difficulties  Cognition Arousal/Alertness: Awake/alert Behavior During Therapy: WFL for tasks assessed/performed Overall Cognitive Status: Within Functional Limits  for tasks assessed                                 General Comments: oriented x4        General Comments      Exercises     Assessment/Plan    PT Assessment Patient does not need any further PT services  PT Problem List         PT Treatment Interventions      PT Goals (Current goals can be found in the Care Plan section)  Acute Rehab PT Goals Patient Stated Goal: to go home PT Goal Formulation: With patient Time For Goal Achievement: 08/25/22 Potential to Achieve Goals: Good    Frequency       Co-evaluation               AM-PAC PT "6 Clicks" Mobility  Outcome Measure Help needed turning from your back to your side while in a flat bed without using bedrails?: None Help needed moving from lying on your back to sitting on the side of a flat bed without using bedrails?: None Help needed moving to and from a bed to a chair (including a wheelchair)?: None Help needed standing up from a chair using your arms (e.g., wheelchair or bedside chair)?: None Help needed to walk in hospital room?: None Help needed climbing 3-5 steps with a railing? : None 6 Click Score: 24    End of Session   Activity Tolerance: Patient tolerated treatment well Patient left: in chair;with call bell/phone within reach   PT Visit Diagnosis: Muscle weakness (generalized) (M62.81)    Time: 7902-4097 PT Time Calculation (min) (ACUTE ONLY): 7 min   Charges:   PT Evaluation $PT Eval Low Complexity: 1 Low           Ha Shannahan PT, DPT 08/25/2022, 1:18 PM

## 2022-08-25 NOTE — Assessment & Plan Note (Signed)
Stable, A1c at 5.9.

## 2022-08-25 NOTE — Progress Notes (Signed)
*  PRELIMINARY RESULTS* Echocardiogram 2D Echocardiogram has been performed.  Lenor Coffin 08/25/2022, 4:23 PM

## 2022-08-25 NOTE — Progress Notes (Signed)
Hassel Neth III to be D/C'd  per MD order.  Discussed with the patient and all questions fully answered.  VSS, Skin clean, dry and intact without evidence of skin break down, no evidence of skin tears noted.  IV catheter discontinued intact. Site without signs and symptoms of complications. Dressing and pressure applied.  An After Visit Summary was printed and given to the patient. Patient received prescription.  D/c education completed with patient/family including follow up instructions, medication list, d/c activities limitations if indicated, with other d/c instructions as indicated by MD - patient able to verbalize understanding, all questions fully answered.   Patient instructed to return to ED, call 911, or call MD for any changes in condition.   Patient to be escorted via WC, and D/C home via private auto.

## 2022-08-25 NOTE — TOC Initial Note (Signed)
Transition of Care [facility]) - Initial/Assessment Note    Patient Details  Name: Travis Watson MRN: [id number] Date of Birth: 03/06/1956  Transition of Care [facility]) CM/SW Contact:    Durenda Guthrie, RN Phone Number: 08/25/2022, 11:36 AM  Clinical Narrative:                 Transition of Care Screening Note:  Transition of Care Department [facility]) has reviewed patient and no TOC needs have been identified at this time. We will continue to monitor patient advancement through Interdisciplinary progressions. If new patient transition needs arise, please place a consult.         Patient Goals and CMS Choice        Expected Discharge Plan and Services                                                Prior Living Arrangements/Services                       Activities of Daily Living Home Assistive Devices/Equipment: Eyeglasses ADL Screening (condition at time of admission) Patient's cognitive ability adequate to safely complete daily activities?: Yes Is the patient deaf or have difficulty hearing?: No Does the patient have difficulty seeing, even when wearing glasses/contacts?: No Does the patient have difficulty concentrating, remembering, or making decisions?: No Patient able to express need for assistance with ADLs?: Yes Does the patient have difficulty dressing or bathing?: No Independently performs ADLs?: Yes (appropriate for developmental age) Does the patient have difficulty walking or climbing stairs?: No Weakness of Legs: None Weakness of Arms/Hands: None  Permission Sought/Granted                  Emotional Assessment              Admission diagnosis:  Acute CVA (cerebrovascular accident) [facility]) [I63.9] Patient Active Problem List   Diagnosis Date Noted   Acute CVA (cerebrovascular accident) (HCC) 08/24/2022   Overweight (BMI 25.0-29.9) 07/11/2019   Hx of colonic polyps 12/30/2018   Renal cyst 10/19/2018   Paraseptal  emphysema (HCC) 08/13/2018   Low HDL (under 40) 06/11/2018   Prediabetes 05/29/2018   Degenerative disc disease, cervical 11/25/2016   Neural foraminal stenosis of cervical spine 11/25/2016   Tobacco abuse counseling 12/25/2015   Hypertension 08/23/2015   Hyperlipidemia 08/23/2015   PCP:  Margarita Mail, DO Pharmacy:   [phone] - [place], [place] - [address] [place] [address] Phone: [phone] Fax: [phone]     Social Determinants of Health (SDOH) Interventions    Readmission Risk Interventions     No data to display

## 2022-08-25 NOTE — Hospital Course (Signed)
66 year old male with past medical history of hypertension, COPD and hyperlipidemia plus tobacco abuse presented to the emergency room on 9/3 with complaints of 1 day of numbness on his lips and numbness of his first 3 fingers of left hand and toes.  In emergency room, work-up revealed a right thalamus infarct.  Patient brought in for further stroke work-up and started on aspirin.

## 2022-08-26 ENCOUNTER — Ambulatory Visit: Payer: Self-pay | Admitting: *Deleted

## 2022-08-26 ENCOUNTER — Other Ambulatory Visit: Payer: Self-pay | Admitting: Internal Medicine

## 2022-08-26 DIAGNOSIS — E782 Mixed hyperlipidemia: Secondary | ICD-10-CM

## 2022-08-26 MED ORDER — ATORVASTATIN CALCIUM 80 MG PO TABS: 80.0000 mg | ORAL_TABLET | Freq: Every day | ORAL | 1 refills | Status: DC

## 2022-08-26 NOTE — Progress Notes (Unsigned)
Established Patient Office Visit  Subjective   Patient ID: Travis Watson, male    DOB: July 15, 1956  Age: 66 y.o. MRN: [id number]  No chief complaint on file.   HPI Travis Watson is a 66 year old male here for hospital follow up and follow up on chronic medial conditions.   Discharge Date: 08/25/22 Diagnosis: acute CVA Procedures/tests: see below Consultants: None New medications: Nicotine patch 14 mg, aspirin 325 mg, Lipitor increased to 80 mg daily Discontinued medications: None Discharge instructions:  Follow up with PCP and Neurology  Status: {Blank multiple:19196::"better","worse","stable","fluctuating"}  Presented to the ER complaining of numbness on his lips and first 3 digits on his left hand and toes. MRI showing right thalamus infarct. Started on daily aspirin. Carotid US showing no significant carotid artery stenosis with patent vertebral arteries with antegrade flow. Echo showing EF 55-60% with grade I diastolic dysfunction. Appointment with Neurology placed.   Hypertension: -Medications: Losartan 25 mg  -Patient is compliant with above medications and reports no side effects. -Checking BP at home (average): not checking, has been controlled for years -Denies any SOB, CP, vision changes, LE edema or symptoms of hypotension  HLD: -Medications: Lipitor 80 mg  -Patient is compliant with above medications and reports no side effects.  -Last lipid panel: Lipid Panel     Component Value Date/Time   CHOL 131 08/25/2022 0504   CHOL 153 06/25/2016 0847   TRIG 79 08/25/2022 0504   HDL 28 (L) 08/25/2022 0504   HDL 31 (L) 06/25/2016 0847   CHOLHDL 4.7 08/25/2022 0504   VLDL 16 08/25/2022 0504   LDLCALC 87 08/25/2022 0504   LDLCALC 66 11/21/2021 1008   LABVLDL 23 06/25/2016 0847   Pre-Diabetes: -Last A1c 5.8% 12/22 -Trying to watch diet -Extensive family history of diabetes   Health Maintenance: -Blood work UTD -Due for Tdap, Prevnar 20 which he  politely declines today -Colon cancer screening: colonoscopy about 10 years ago, due      Review of Systems  Constitutional:  Negative for chills and fever.  Eyes:  Negative for blurred vision.  Respiratory:  Negative for shortness of breath.   Cardiovascular:  Negative for chest pain.  Gastrointestinal:  Negative for abdominal pain, constipation and diarrhea.  Neurological:  Negative for headaches.      Objective:     There were no vitals taken for this visit. BP Readings from Last 3 Encounters:  08/25/22 (!) 145/67  05/23/22 124/68  11/21/21 132/64   Wt Readings from Last 3 Encounters:  08/24/22 179 lb 7.3 oz (81.4 kg)  05/23/22 178 lb 11.2 oz (81.1 kg)  11/21/21 175 lb (79.4 kg)      Physical Exam Constitutional:      Appearance: Normal appearance.  HENT:     Head: Normocephalic and atraumatic.  Eyes:     Conjunctiva/sclera: Conjunctivae normal.  Neck:     Vascular: No carotid bruit.  Cardiovascular:     Rate and Rhythm: Normal rate and regular rhythm.  Pulmonary:     Effort: Pulmonary effort is normal.     Breath sounds: Normal breath sounds.  Abdominal:     General: There is no distension.     Palpations: Abdomen is soft.     Tenderness: There is no abdominal tenderness.  Musculoskeletal:     Right lower leg: No edema.     Left lower leg: No edema.  Skin:    General: Skin is warm and dry.  Neurological:  General: No focal deficit present.     Mental Status: He is alert. Mental status is at baseline.  Psychiatric:        Mood and Affect: Mood normal.        Behavior: Behavior normal.      No results found for any visits on 08/27/22.  Last CBC Lab Results  Component Value Date   WBC 13.2 (H) 08/24/2022   HGB 15.1 08/24/2022   HCT 45.4 08/24/2022   MCV 97.4 08/24/2022   MCH 32.4 08/24/2022   RDW 12.2 08/24/2022   PLT 250 16/09/9603   Last metabolic panel Lab Results  Component Value Date   GLUCOSE 124 (H) 08/24/2022   NA 137  08/24/2022   K 4.0 08/24/2022   CL 106 08/24/2022   CO2 23 08/24/2022   BUN 14 08/24/2022   CREATININE 0.85 08/24/2022   EGFR 100 11/21/2021   CALCIUM 8.8 (L) 08/24/2022   PROT 6.7 11/21/2021   ALBUMIN 4.2 01/22/2017   LABGLOB 2.9 06/25/2016   AGRATIO 1.5 06/25/2016   BILITOT 0.6 11/21/2021   ALKPHOS 53 01/22/2017   AST 18 11/21/2021   ALT 22 11/21/2021   ANIONGAP 8 08/24/2022   Last lipids Lab Results  Component Value Date   CHOL 131 08/25/2022   HDL 28 (L) 08/25/2022   LDLCALC 87 08/25/2022   TRIG 79 08/25/2022   CHOLHDL 4.7 08/25/2022   Last hemoglobin A1c Lab Results  Component Value Date   HGBA1C 5.9 (H) 08/24/2022   Last thyroid functions Lab Results  Component Value Date   TSH 2.520 08/24/2022   Last vitamin D No results found for: "25OHVITD2", "25OHVITD3", "VD25OH" Last vitamin B12 and Folate No results found for: "VITAMINB12", "FOLATE"    The ASCVD Risk score (Arnett DK, et al., 2019) failed to calculate for the following reasons:   The patient has a prior MI or stroke diagnosis    Assessment & Plan:   Problem List Items Addressed This Visit   None  No follow-ups on file.    Teodora Medici, DO

## 2022-08-26 NOTE — Telephone Encounter (Signed)
  Chief Complaint: Medication Dosage Symptoms: None Frequency: NA Pertinent Negatives: Patient denies NA Disposition: [] ED /[] Urgent Care (no appt availability in office) / [] Appointment(In office/virtual)/ []  [place] [facility]/ [] Home Care/ [] Refused Recommended Disposition /[] [place]/ [x]  Follow-up with PCP Additional Notes: Pt reports had "mild" CVA over weekend. Dr. at hospital stated he wanted to increase his Atorvastatin from 40mg  to 80mg . Pt states when picked up med, Rx reads 80mg  tabs, take 1/2 tab. Daily. Would like clarification. States he took 2 tabs this AM for 80mg  dose. Please review and advise. Reason for Disposition  [1] Caller has URGENT medicine question about med that PCP or specialist prescribed AND [2] triager unable to answer question  Answer Assessment - Initial Assessment Questions 1. NAME of MEDICINE: "What medicine(s) are you calling about?"     Atorvastatin 2. QUESTION: "What is your question?" (e.g., double dose of medicine, side effect)     Dose 3. PRESCRIBER: "Who prescribed the medicine?" Reason: if prescribed by specialist, call should be referred to that group.     Hospital,   Dr. 4. SYMPTOMS: "Do you have any symptoms?" If Yes, ask: "What symptoms are you having?"  "How bad are the symptoms (e.g., mild, moderate, severe)     None  Protocols used: Medication Question Call-A-AH

## 2022-08-26 NOTE — Telephone Encounter (Signed)
Pt is scheduled °

## 2022-08-27 ENCOUNTER — Ambulatory Visit (INDEPENDENT_AMBULATORY_CARE_PROVIDER_SITE_OTHER): Payer: Medicare Other | Admitting: Internal Medicine

## 2022-08-27 ENCOUNTER — Encounter: Payer: Self-pay | Admitting: Internal Medicine

## 2022-08-27 VITALS — BP 120/64 | HR 62 | Temp 98.1°F | Resp 16 | Wt 179.2 lb

## 2022-08-27 DIAGNOSIS — E782 Mixed hyperlipidemia: Secondary | ICD-10-CM | POA: Diagnosis not present

## 2022-08-27 DIAGNOSIS — Z716 Tobacco abuse counseling: Secondary | ICD-10-CM

## 2022-08-27 DIAGNOSIS — Z09 Encounter for follow-up examination after completed treatment for conditions other than malignant neoplasm: Secondary | ICD-10-CM

## 2022-08-27 DIAGNOSIS — R7303 Prediabetes: Secondary | ICD-10-CM

## 2022-08-27 DIAGNOSIS — I10 Essential (primary) hypertension: Secondary | ICD-10-CM

## 2022-08-27 DIAGNOSIS — Z8673 Personal history of transient ischemic attack (TIA), and cerebral infarction without residual deficits: Secondary | ICD-10-CM | POA: Diagnosis not present

## 2022-08-29 ENCOUNTER — Ambulatory Visit: Payer: Medicare Other | Admitting: Anesthesiology

## 2022-08-29 ENCOUNTER — Other Ambulatory Visit: Payer: Self-pay

## 2022-08-29 ENCOUNTER — Encounter
Admission: RE | Disposition: A | Discharge status: Home / Self Care | Payer: Self-pay | Source: Ambulatory Visit | Attending: Gastroenterology

## 2022-08-29 ENCOUNTER — Ambulatory Visit
Admission: RE | Admit: 2022-08-29 | Discharge: 2022-08-29 | Disposition: A | Discharge status: Home / Self Care | Payer: Medicare Other | Source: Ambulatory Visit | Attending: Gastroenterology | Admitting: Gastroenterology

## 2022-08-29 DIAGNOSIS — Z1211 Encounter for screening for malignant neoplasm of colon: Secondary | ICD-10-CM | POA: Diagnosis not present

## 2022-08-29 DIAGNOSIS — I639 Cerebral infarction, unspecified: Secondary | ICD-10-CM | POA: Insufficient documentation

## 2022-08-29 DIAGNOSIS — Z538 Procedure and treatment not carried out for other reasons: Secondary | ICD-10-CM | POA: Diagnosis not present

## 2022-08-29 DIAGNOSIS — R195 Other fecal abnormalities: Secondary | ICD-10-CM | POA: Diagnosis present

## 2022-08-29 SURGERY — COLONOSCOPY WITH PROPOFOL
Anesthesia: General

## 2022-08-29 MED ORDER — SODIUM CHLORIDE 0.9 % IV SOLN: INTRAVENOUS | Status: DC

## 2022-08-29 NOTE — H&P (Signed)
Colonoscopy is canceled today because of patient's recent acute stroke.  He was discharged from the hospital on 08/25/2022 with neurology follow-up as outpatient next week.  MRI brain on 08/24/2022 revealed acute right thalamic infarct  We will need to obtain clearance from neurology for the timing of colonoscopy.  I would wait at least for 30 days before proceeding with colonoscopy, it is indicated for Cologuard positive test  Patient expressed understanding of my recommendations   Arlyss Repress, MD [facility], [facility] [address]  [address]  [place], [place] [address]  Main: [phone]  Fax: [phone] Pager: [phone]

## 2022-08-29 NOTE — Progress Notes (Signed)
Patients colonoscopy was canceled today due to recent stroke.  Patient will be rescheduled for his colonoscopy once we have received clearance from neurology.   Thanks, [place], [place]

## 2022-09-15 ENCOUNTER — Telehealth: Payer: Self-pay

## 2022-09-15 NOTE — Telephone Encounter (Signed)
Dr. Manuella Ghazi has advised per his completed clearane "Pt should hold on elective procedure for 6 months since stroke (08-24-22).  Thanks,  [place], [place]

## 2022-11-23 NOTE — Progress Notes (Unsigned)
Established Patient Office Visit  Subjective   Patient ID: Travis Watson, male    DOB: 1956-06-20  Age: 66 y.o. MRN: [id number]  No chief complaint on file.   HPI Travis Watson is a 66 year old male here for follow up on chronic medial conditions.   Hypertension: -Medications: Losartan 25 mg  -Patient is compliant with above medications and reports no side effects. -Checking BP at home (average): not checking, has been controlled for years -Denies any SOB, CP, vision changes, LE edema or symptoms of hypotension  HLD: -Medications: Lipitor 80 mg, aspirin  -Patient is compliant with above medications and reports no side effects.  -Last lipid panel: Lipid Panel     Component Value Date/Time   CHOL 131 08/25/2022 0504   CHOL 153 06/25/2016 0847   TRIG 79 08/25/2022 0504   HDL 28 (L) 08/25/2022 0504   HDL 31 (L) 06/25/2016 0847   CHOLHDL 4.7 08/25/2022 0504   VLDL 16 08/25/2022 0504   LDLCALC 87 08/25/2022 0504   LDLCALC 66 11/21/2021 1008   LABVLDL 23 06/25/2016 0847   Pre-Diabetes: -Last A1c 5.9, 9/23 -Trying to watch diet -Extensive family history of diabetes   Health Maintenance: -Blood work UTD -Due for Tdap, Prevnar 20 which he politely declines today -Colon cancer screening: colonoscopy about 10 years ago, due      Review of Systems  Constitutional:  Negative for chills and fever.  Eyes:  Negative for blurred vision.  Respiratory:  Negative for shortness of breath.   Cardiovascular:  Negative for chest pain.  Gastrointestinal:  Negative for abdominal pain, constipation and diarrhea.  Neurological:  Negative for headaches.      Objective:     There were no vitals taken for this visit. BP Readings from Last 3 Encounters:  08/29/22 (!) 156/94  08/27/22 120/64  08/25/22 (!) 145/67   Wt Readings from Last 3 Encounters:  08/29/22 180 lb (81.6 kg)  08/27/22 179 lb 3.2 oz (81.3 kg)  08/24/22 179 lb 7.3 oz (81.4 kg)      Physical  Exam Constitutional:      Appearance: Normal appearance.  HENT:     Head: Normocephalic and atraumatic.  Eyes:     Conjunctiva/sclera: Conjunctivae normal.  Neck:     Vascular: No carotid bruit.  Cardiovascular:     Rate and Rhythm: Normal rate and regular rhythm.  Pulmonary:     Effort: Pulmonary effort is normal.     Breath sounds: Normal breath sounds.  Abdominal:     General: There is no distension.     Palpations: Abdomen is soft.     Tenderness: There is no abdominal tenderness.  Musculoskeletal:     Right lower leg: No edema.     Left lower leg: No edema.  Skin:    General: Skin is warm and dry.  Neurological:     General: No focal deficit present.     Mental Status: He is alert. Mental status is at baseline.  Psychiatric:        Mood and Affect: Mood normal.        Behavior: Behavior normal.      No results found for any visits on 11/24/22.  Last CBC Lab Results  Component Value Date   WBC 13.2 (H) 08/24/2022   HGB 15.1 08/24/2022   HCT 45.4 08/24/2022   MCV 97.4 08/24/2022   MCH 32.4 08/24/2022   RDW 12.2 08/24/2022   PLT 250 08/24/2022  Last metabolic panel Lab Results  Component Value Date   GLUCOSE 124 (H) 08/24/2022   NA 137 08/24/2022   K 4.0 08/24/2022   CL 106 08/24/2022   CO2 23 08/24/2022   BUN 14 08/24/2022   CREATININE 0.85 08/24/2022   EGFR 100 11/21/2021   CALCIUM 8.8 (L) 08/24/2022   PROT 6.7 11/21/2021   ALBUMIN 4.2 01/22/2017   LABGLOB 2.9 06/25/2016   AGRATIO 1.5 06/25/2016   BILITOT 0.6 11/21/2021   ALKPHOS 53 01/22/2017   AST 18 11/21/2021   ALT 22 11/21/2021   ANIONGAP 8 08/24/2022   Last lipids Lab Results  Component Value Date   CHOL 131 08/25/2022   HDL 28 (L) 08/25/2022   LDLCALC 87 08/25/2022   TRIG 79 08/25/2022   CHOLHDL 4.7 08/25/2022   Last hemoglobin A1c Lab Results  Component Value Date   HGBA1C 5.9 (H) 08/24/2022   Last thyroid functions Lab Results  Component Value Date   TSH 2.520  08/24/2022   Last vitamin D No results found for: "25OHVITD2", "25OHVITD3", "VD25OH" Last vitamin B12 and Folate No results found for: "VITAMINB12", "FOLATE"    The ASCVD Risk score (Arnett DK, et al., 2019) failed to calculate for the following reasons:   The patient has a prior MI or stroke diagnosis    Assessment & Plan:   Problem List Items Addressed This Visit   None  No follow-ups on file.    Teodora Medici, DO

## 2022-11-24 ENCOUNTER — Encounter: Payer: Self-pay | Admitting: Internal Medicine

## 2022-11-24 ENCOUNTER — Ambulatory Visit (INDEPENDENT_AMBULATORY_CARE_PROVIDER_SITE_OTHER): Payer: Medicare Other | Admitting: Internal Medicine

## 2022-11-24 VITALS — BP 116/62 | HR 87 | Temp 98.3°F | Resp 16 | Ht 69.0 in | Wt 181.7 lb

## 2022-11-24 DIAGNOSIS — R7303 Prediabetes: Secondary | ICD-10-CM | POA: Diagnosis not present

## 2022-11-24 DIAGNOSIS — E782 Mixed hyperlipidemia: Secondary | ICD-10-CM | POA: Diagnosis not present

## 2022-11-24 DIAGNOSIS — I10 Essential (primary) hypertension: Secondary | ICD-10-CM | POA: Diagnosis not present

## 2022-11-24 DIAGNOSIS — Z8673 Personal history of transient ischemic attack (TIA), and cerebral infarction without residual deficits: Secondary | ICD-10-CM | POA: Diagnosis not present

## 2022-11-24 MED ORDER — LOSARTAN POTASSIUM 25 MG PO TABS: 25.0000 mg | ORAL_TABLET | Freq: Every day | ORAL | 5 refills | Status: DC

## 2023-02-23 ENCOUNTER — Other Ambulatory Visit: Payer: Self-pay | Admitting: Internal Medicine

## 2023-02-23 DIAGNOSIS — E782 Mixed hyperlipidemia: Secondary | ICD-10-CM

## 2023-02-24 NOTE — Telephone Encounter (Signed)
Requested Prescriptions  Pending Prescriptions Disp Refills   atorvastatin (LIPITOR) 80 MG tablet [Pharmacy Med Name: ATORVASTATIN 80 MG TABLET] 90 tablet 1    Sig: TAKE 1 TABLET BY MOUTH EVERY DAY     Cardiovascular:  Antilipid - Statins Failed - 02/23/2023  1:47 AM      Failed - Lipid Panel in normal range within the last 12 months    Cholesterol, Total  Date Value Ref Range Status  06/25/2016 153 100 - 199 mg/dL Final   Cholesterol  Date Value Ref Range Status  08/25/2022 131 0 - 200 mg/dL Final   LDL Cholesterol (Calc)  Date Value Ref Range Status  11/21/2021 66 mg/dL (calc) Final    Comment:    Reference range: <100 . Desirable range <100 mg/dL for primary prevention;   <70 mg/dL for patients with CHD or diabetic patients  with > or = 2 CHD risk factors. Mar[place] Kitchen LDL-C is now calculated using the Martin-Hopkins  calculation, which is a validated novel method providing  better accuracy than the Friedewald equation in the  estimation of LDL-C.  Cresenciano Genre et al. Annamaria Helling. WG:[id number]): 2061-2068  (http://education.[facility].com/faq/FAQ164)    LDL Cholesterol  Date Value Ref Range Status  08/25/2022 87 0 - 99 mg/dL Final    Comment:           Total Cholesterol/HDL:CHD Risk Coronary Heart Disease Risk Table                     Men   Women  1/2 Average Risk   3.4   3.3  Average Risk       5.0   4.4  2 X Average Risk   9.6   7.1  3 X Average Risk  23.4   11.0        Use the calculated Patient Ratio above and the CHD Risk Table to determine the patient's CHD Risk.        ATP III CLASSIFICATION (LDL):  <100     mg/dL   Optimal  100-129  mg/dL   Near or Above                    Optimal  130-159  mg/dL   Borderline  160-189  mg/dL   High  >190     mg/dL   Very High Performed at [facility], [place], [place] [address]    HDL  Date Value Ref Range Status  08/25/2022 28 (L) >40 mg/dL Final  06/25/2016 31 (L) >39 mg/dL Final   Triglycerides   Date Value Ref Range Status  08/25/2022 79 <150 mg/dL Final         Passed - Patient is not pregnant      Passed - Valid encounter within last 12 months    Recent Outpatient Visits           3 months ago Essential hypertension   [place], DO   6 months ago Hospital discharge follow-up   [facility] Teodora Medici, DO   9 months ago Primary hypertension   [facility] Teodora Medici, DO   1 year ago Annual physical exam   [facility] Teodora Medici, DO   1 year ago Hyperlipidemia with low HDL   [facility] Delsa Grana, [place]       Future Appointments  In 3 months [place]

## 2023-04-06 DIAGNOSIS — L814 Other melanin hyperpigmentation: Secondary | ICD-10-CM | POA: Diagnosis not present

## 2023-04-06 DIAGNOSIS — L905 Scar conditions and fibrosis of skin: Secondary | ICD-10-CM | POA: Diagnosis not present

## 2023-04-06 DIAGNOSIS — Z08 Encounter for follow-up examination after completed treatment for malignant neoplasm: Secondary | ICD-10-CM | POA: Diagnosis not present

## 2023-04-06 DIAGNOSIS — L404 Guttate psoriasis: Secondary | ICD-10-CM | POA: Diagnosis not present

## 2023-04-06 DIAGNOSIS — L821 Other seborrheic keratosis: Secondary | ICD-10-CM | POA: Diagnosis not present

## 2023-04-06 DIAGNOSIS — D225 Melanocytic nevi of trunk: Secondary | ICD-10-CM | POA: Diagnosis not present

## 2023-04-06 DIAGNOSIS — Z85828 Personal history of other malignant neoplasm of skin: Secondary | ICD-10-CM | POA: Diagnosis not present

## 2023-04-23 ENCOUNTER — Telehealth: Payer: Self-pay | Admitting: Internal Medicine

## 2023-04-23 NOTE — Telephone Encounter (Signed)
Contacted Travis Watson to schedule their annual wellness visit. Appointment made for 05/28/2023. Left a detailed message informing patient of appt date and time change.  [facility] Care Guide [facility] AWV TEAM Direct Dial: [phone]

## 2023-05-26 ENCOUNTER — Ambulatory Visit: Payer: Medicare Other

## 2023-05-28 ENCOUNTER — Ambulatory Visit: Payer: Medicare Other

## 2023-06-19 ENCOUNTER — Ambulatory Visit (INDEPENDENT_AMBULATORY_CARE_PROVIDER_SITE_OTHER): Payer: Medicare Other

## 2023-06-19 VITALS — Ht 69.0 in | Wt 181.0 lb

## 2023-06-19 DIAGNOSIS — Z0000 Encounter for general adult medical examination without abnormal findings: Secondary | ICD-10-CM | POA: Diagnosis not present

## 2023-06-19 NOTE — Progress Notes (Signed)
Subjective:   Travis Watson is a 67 y.o. male who presents for an Initial Medicare Annual Wellness Visit.  Visit Complete: Virtual  I connected with  Travis Watson on 06/19/23 by a audio enabled telemedicine application and verified that I am speaking with the correct person using two identifiers.  Patient Location: Home  Provider Location: Office/Clinic  I discussed the limitations of evaluation and management by telemedicine. The patient expressed understanding and agreed to proceed.  Patient Medicare AWV questionnaire was completed by the patient on (06/16/23); I have confirmed that all information answered by patient is correct and no changes since this date.  Review of Systems         Objective:    There were no vitals filed for this visit. There is no height or weight on file to calculate BMI.     08/29/2022   10:06 AM 08/24/2022    5:26 PM 08/24/2022   11:01 AM 07/22/2017    8:19 AM 01/22/2017    8:29 AM 11/25/2016    2:04 PM 11/18/2016    8:49 AM  Advanced Directives  Does Patient Have a Medical Advance Directive? No No No No No No No  Would patient like information on creating a medical advance directive?  Yes (Inpatient - patient defers creating a medical advance directive at this time - Information given)         Current Medications (verified) Outpatient Encounter Medications as of 06/19/2023  Medication Sig   aspirin EC 81 MG tablet Take 1 tablet (81 mg total) by mouth daily. Swallow whole.   atorvastatin (LIPITOR) 80 MG tablet TAKE 1 TABLET BY MOUTH EVERY DAY   fluocinonide cream (LIDEX) 0.05 % APPLY ON THE SKIN TWICE A DAY AS NEEDED   losartan (COZAAR) 25 MG tablet Take 1 tablet (25 mg total) by mouth daily.   No facility-administered encounter medications on file as of 06/19/2023.    Allergies (verified) Patient has no known allergies.   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 Family History  Problem Relation Age of Onset   Diabetes Mother    Emphysema Mother    Hyperlipidemia Father    Cancer Father        prostate   Heart disease Maternal Grandmother    Hypertension Maternal Grandmother    Heart disease Maternal Grandfather    Hypertension Maternal Grandfather    Migraines Paternal Grandmother    Social History   Socioeconomic History   Marital status: Single    Spouse name: Not on file   Number of children: Not on file   Years of education: Not on file   Highest education level: Not on file  Occupational History   Not on file  Tobacco Use   Smoking status: Every Day    Packs/day: 1.00    Years: 49.00    Additional pack years: 0.00    Total pack years: 49.00    Types: Cigarettes   Smokeless tobacco: Former    Types: Snuff   Tobacco comments:    quit smokeless tabacco about 40 years ago  Vaping Use   Vaping Use: Never used  Substance and Sexual Activity   Alcohol use: Yes    Alcohol/week: 0.0 standard drinks of alcohol    Comment: occasional   Drug use: No   Sexual activity: Not Currently  Other Topics Concern   Not on file  Social History Narrative   Not on file   Social Determinants of Health   Financial Resource Strain: Low Risk  (11/21/2021)   Overall Financial Resource Strain (CARDIA)    Difficulty of Paying Living Expenses: Not hard at all  Food Insecurity: No Food Insecurity (11/21/2021)   Hunger Vital Sign    Worried About Running Out of Food in the Last Year: Never true    Ran Out of Food in the Last Year: Never true  Transportation Needs: No Transportation Needs (11/21/2021)   PRAPARE - Administrator, Civil Service (Medical): No    Lack of Transportation (Non-Medical): No  Physical Activity: Insufficiently Active (11/21/2021)   Exercise Vital Sign    Days of Exercise per Week: 3 days    Minutes of Exercise per Session: 20 min  Stress: No  Stress Concern Present (11/21/2021)   [employer] of Occupational Health - Occupational Stress Questionnaire    Feeling of Stress : Not at all  Social Connections: Socially Isolated (11/21/2021)   Social Connection and Isolation Panel [NHANES]    Frequency of Communication with Friends and Family: Twice a week    Frequency of Social Gatherings with Friends and Family: Twice a week    Attends Religious Services: Never    Database administrator or Organizations: No    Attends Engineer, structural: Never    Marital Status: Divorced    Tobacco Counseling Ready to quit: Not Answered Counseling given: Not Answered Tobacco comments: quit smokeless tabacco about 40 years ago   Clinical Intake:              How often do you need to have someone help you when you read instructions, pamphlets, or other written materials from your doctor or pharmacy?: (P) 1 - Never         Activities of Daily Living    06/16/2023    6:41 AM 11/24/2022    8:20 AM  In your present state of health, do you have any difficulty performing the following activities:  Hearing? 0 0  Vision? 0 0  Difficulty concentrating or making decisions? 0 0  Walking or climbing stairs? 0 0  Dressing or bathing? 0 0  Doing errands, shopping? 0 0  Preparing Food and eating ? N   Using the Toilet? N   In the past six months, have you accidently leaked urine? N   Do you have problems with loss of bowel control? N   Managing your Medications? N   Managing your Finances? N   Housekeeping or managing your Housekeeping? N     Patient Care Team: Margarita Mail, DO as PCP - General (Internal Medicine)  Indicate any recent Medical Services you may have received from other than [facility] providers in the past year (date may be approximate).     Assessment:   This is a routine wellness examination for Travis Watson.  Hearing/Vision screen No results found.  Dietary issues and exercise activities discussed:      Goals Addressed   None    Depression Screen    11/24/2022    8:19 AM 08/27/2022    8:22 AM 05/23/2022    8:18 AM 11/21/2021    9:39 AM 04/02/2021    8:15 AM 10/02/2020    8:08 AM 01/12/2020    7:18 AM  PHQ 2/9 Scores  PHQ - 2 Score 0 0 0 0 0 0 0  PHQ- 9 Score 0 0 0 0   0    Fall Risk    06/16/2023    6:41 AM 11/24/2022    8:18 AM 08/27/2022    8:22 AM 05/23/2022    8:16 AM 11/21/2021    9:37 AM  Fall Risk   Falls in the past year? 0 0 0 0 0  Number falls in past yr: 0 0 0 0 0  Injury with Fall? 0 0 0 0 0    MEDICARE RISK AT HOME:   TIMED UP AND GO:  Was the test performed? No    Cognitive Function:        Immunizations  There is no immunization history on file for this patient.  TDAP status: Up to datedoes not take  Flu Vaccine status: Declined, Education has been provided regarding the importance of this vaccine but patient still declined. Advised may receive this vaccine at local pharmacy or Health Dept. Aware to provide a copy of the vaccination record if obtained from local pharmacy or Health Dept. Verbalized acceptance and understanding.  Pneumococcal vaccine status: Declined,  Education has been provided regarding the importance of this vaccine but patient still declined. Advised may receive this vaccine at local pharmacy or Health Dept. Aware to provide a copy of the vaccination record if obtained from local pharmacy or Health Dept. Verbalized acceptance and understanding.   Covid-19 vaccine status: Declined, Education has been provided regarding the importance of this vaccine but patient still declined. Advised may receive this vaccine at local pharmacy or Health Dept.or vaccine clinic. Aware to provide a copy of the vaccination record if obtained from local pharmacy or Health Dept. Verbalized acceptance and understanding.  Qualifies for Shingles Vaccine? Yes   Zostavax completed No   Shingrix Completed?: No.    Education has been provided regarding the  importance of this vaccine. Patient has been advised to call insurance company to determine out of pocket expense if they have not yet received this vaccine. Advised may also receive vaccine at local pharmacy or Health Dept. Verbalized acceptance and understanding.  Screening Tests Health Maintenance  Topic Date Due   Medicare Annual Wellness (AWV)  Never done   COVID-19 Vaccine (1) Never done   Zoster Vaccines- Shingrix (1 of 2) Never done   Lung Cancer Screening  01/14/2022   Colonoscopy  04/30/2023   Pneumonia Vaccine 46+ Years old (1 of 2 - PCV) 11/25/2023 (Originally 04/01/1962)   INFLUENZA VACCINE  07/23/2023   Hepatitis C Screening  Completed   HPV VACCINES  Aged Out   DTaP/Tdap/Td  Discontinued    Health Maintenance  Health Maintenance Due  Topic Date Due   Medicare Annual Wellness (AWV)  Never done   COVID-19 Vaccine (1) Never done   Zoster Vaccines- Shingrix (1 of 2) Never done   Lung Cancer Screening  01/14/2022   Colonoscopy  04/30/2023    Colorectal cancer screening: Type of screening: Colonoscopy. Completed yes. Repeat every 5-10 years  Lung Cancer Screening: (Low Dose CT Chest recommended if Age 35-80 years, 20 pack-year currently smoking OR have quit w/in 15years.) does qualify.   Lung Cancer Screening Referral: yes  Additional Screening:  Hepatitis C Screening: does not qualify; Completed yes  Vision Screening: Recommended annual ophthalmology exams for early detection of glaucoma and other disorders of the eye. Is the patient up to date with their annual eye exam?  Yes  Who is the provider or what is the name of the office in  which the patient attends annual eye exams? [facility] If pt is not established with a provider, would they like to be referred to a provider to establish care? No .   Dental Screening: Recommended annual dental exams for proper oral hygiene  Diabetic Foot Exam: n/a  Community Resource Referral / Chronic Care  Management: CRR required this visit?  No   CCM required this visit?  No    Plan:     I have personally reviewed and noted the following in the patient's chart:   Medical and social history Use of alcohol, tobacco or illicit drugs  Current medications and supplements including opioid prescriptions. Patient is not currently taking opioid prescriptions. Functional ability and status Nutritional status Physical activity Advanced directives List of other physicians Hospitalizations, surgeries, and ER visits in previous 12 months Vitals Screenings to include cognitive, depression, and falls Referrals and appointments  In addition, I have reviewed and discussed with patient certain preventive protocols, quality metrics, and best practice recommendations. A written personalized care plan for preventive services as well as general preventive health recommendations were provided to patient.     Sue Lush, LPN   [id number]/82/9562   After Visit Summary: (MyChart) Due to this being a telephonic visit, the after visit summary with patients personalized plan was offered to patient via MyChart   Nurse Notes: The patient states doing well and has no concerns or questions at this time.

## 2023-06-19 NOTE — Patient Instructions (Addendum)
Travis Watson , Thank you for taking time to come for your Medicare Wellness Visit. I appreciate your ongoing commitment to your health goals. Please review the following plan we discussed and let me know if I can assist you in the future.   These are the goals we discussed:  Goals   None     This is a list of the screening recommended for you and due dates:  Health Maintenance  Topic Date Due   COVID-19 Vaccine (1) Never done   Zoster (Shingles) Vaccine (1 of 2) Never done   Screening for Lung Cancer  01/14/2022   Colon Cancer Screening  04/30/2023   Pneumonia Vaccine (1 of 2 - PCV) 11/25/2023*   Flu Shot  07/23/2023   Medicare Annual Wellness Visit  06/18/2024   Hepatitis C Screening  Completed   HPV Vaccine  Aged Out   DTaP/Tdap/Td vaccine  Discontinued  *Topic was postponed. The date shown is not the original due date.    Advanced directives: no  Conditions/risks identified: none  Next appointment: Follow up in one year for your annual wellness visit. 06/24/2023 @ 3:30pm telephone  Preventive Care 65 Years and Older, Male  Preventive care refers to lifestyle choices and visits with your health care provider that can promote health and wellness. What does preventive care include? A yearly physical exam. This is also called an annual well check. Dental exams once or twice a year. Routine eye exams. Ask your health care provider how often you should have your eyes checked. Personal lifestyle choices, including: Daily care of your teeth and gums. Regular physical activity. Eating a healthy diet. Avoiding tobacco and drug use. Limiting alcohol use. Practicing safe sex. Taking low doses of aspirin every day. Taking vitamin and mineral supplements as recommended by your health care provider. What happens during an annual well check? The services and screenings done by your health care provider during your annual well check will depend on your age, overall health, lifestyle  risk factors, and family history of disease. Counseling  Your health care provider may ask you questions about your: Alcohol use. Tobacco use. Drug use. Emotional well-being. Home and relationship well-being. Sexual activity. Eating habits. History of falls. Memory and ability to understand (cognition). Work and work Astronomer. Screening  You may have the following tests or measurements: Height, weight, and BMI. Blood pressure. Lipid and cholesterol levels. These may be checked every 5 years, or more frequently if you are over 77 years old. Skin check. Lung cancer screening. You may have this screening every year starting at age 13 if you have a 30-pack-year history of smoking and currently smoke or have quit within the past 15 years. Fecal occult blood test (FOBT) of the stool. You may have this test every year starting at age 6. Flexible sigmoidoscopy or colonoscopy. You may have a sigmoidoscopy every 5 years or a colonoscopy every 10 years starting at age 21. Prostate cancer screening. Recommendations will vary depending on your family history and other risks. Hepatitis C blood test. Hepatitis B blood test. Sexually transmitted disease (STD) testing. Diabetes screening. This is done by checking your blood sugar (glucose) after you have not eaten for a while (fasting). You may have this done every 1-3 years. Abdominal aortic aneurysm (AAA) screening. You may need this if you are a current or former smoker. Osteoporosis. You may be screened starting at age 31 if you are at high risk. Talk with your health care provider about your test  results, treatment options, and if necessary, the need for more tests. Vaccines  Your health care provider may recommend certain vaccines, such as: Influenza vaccine. This is recommended every year. Tetanus, diphtheria, and acellular pertussis (Tdap, Td) vaccine. You may need a Td booster every 10 years. Zoster vaccine. You may need this after age  46. Pneumococcal 13-valent conjugate (PCV13) vaccine. One dose is recommended after age 65. Pneumococcal polysaccharide (PPSV23) vaccine. One dose is recommended after age 20. Talk to your health care provider about which screenings and vaccines you need and how often you need them. This information is not intended to replace advice given to you by your health care provider. Make sure you discuss any questions you have with your health care provider. Document Released: 01/04/2016 Document Revised: 08/27/2016 Document Reviewed: 10/09/2015 Elsevier Interactive Patient Education  2017 [place] Prevention in the Home Falls can cause injuries. They can happen to people of all ages. There are many things you can do to make your home safe and to help prevent falls. What can I do on the outside of my home? Regularly fix the edges of walkways and driveways and fix any cracks. Remove anything that might make you trip as you walk through a door, such as a raised step or threshold. Trim any bushes or trees on the path to your home. Use bright outdoor lighting. Clear any walking paths of anything that might make someone trip, such as rocks or tools. Regularly check to see if handrails are loose or broken. Make sure that both sides of any steps have handrails. Any raised decks and porches should have guardrails on the edges. Have any leaves, snow, or ice cleared regularly. Use sand or salt on walking paths during winter. Clean up any spills in your garage right away. This includes oil or grease spills. What can I do in the bathroom? Use night lights. Install grab bars by the toilet and in the tub and shower. Do not use towel bars as grab bars. Use non-skid mats or decals in the tub or shower. If you need to sit down in the shower, use a plastic, non-slip stool. Keep the floor dry. Clean up any water that spills on the floor as soon as it happens. Remove soap buildup in the tub or shower  regularly. Attach bath mats securely with double-sided non-slip rug tape. Do not have throw rugs and other things on the floor that can make you trip. What can I do in the bedroom? Use night lights. Make sure that you have a light by your bed that is easy to reach. Do not use any sheets or blankets that are too big for your bed. They should not hang down onto the floor. Have a firm chair that has side arms. You can use this for support while you get dressed. Do not have throw rugs and other things on the floor that can make you trip. What can I do in the kitchen? Clean up any spills right away. Avoid walking on wet floors. Keep items that you use a lot in easy-to-reach places. If you need to reach something above you, use a strong step stool that has a grab bar. Keep electrical cords out of the way. Do not use floor polish or wax that makes floors slippery. If you must use wax, use non-skid floor wax. Do not have throw rugs and other things on the floor that can make you trip. What can I do with my stairs?  Do not leave any items on the stairs. Make sure that there are handrails on both sides of the stairs and use them. Fix handrails that are broken or loose. Make sure that handrails are as long as the stairways. Check any carpeting to make sure that it is firmly attached to the stairs. Fix any carpet that is loose or worn. Avoid having throw rugs at the top or bottom of the stairs. If you do have throw rugs, attach them to the floor with carpet tape. Make sure that you have a light switch at the top of the stairs and the bottom of the stairs. If you do not have them, ask someone to add them for you. What else can I do to help prevent falls? Wear shoes that: Do not have high heels. Have rubber bottoms. Are comfortable and fit you well. Are closed at the toe. Do not wear sandals. If you use a stepladder: Make sure that it is fully opened. Do not climb a closed stepladder. Make sure that  both sides of the stepladder are locked into place. Ask someone to hold it for you, if possible. Clearly mark and make sure that you can see: Any grab bars or handrails. First and last steps. Where the edge of each step is. Use tools that help you move around (mobility aids) if they are needed. These include: Canes. Walkers. Scooters. Crutches. Turn on the lights when you go into a dark area. Replace any light bulbs as soon as they burn out. Set up your furniture so you have a clear path. Avoid moving your furniture around. If any of your floors are uneven, fix them. If there are any pets around you, be aware of where they are. Review your medicines with your doctor. Some medicines can make you feel dizzy. This can increase your chance of falling. Ask your doctor what other things that you can do to help prevent falls. This information is not intended to replace advice given to you by your health care provider. Make sure you discuss any questions you have with your health care provider. Document Released: 10/04/2009 Document Revised: 05/15/2016 Document Reviewed: 01/12/2015 Elsevier Interactive Patient Education  2017 [employer].

## 2023-07-05 ENCOUNTER — Other Ambulatory Visit: Payer: Self-pay | Admitting: Internal Medicine

## 2023-07-05 DIAGNOSIS — I10 Essential (primary) hypertension: Secondary | ICD-10-CM

## 2023-07-06 NOTE — Telephone Encounter (Signed)
Courtesy refill. Patient will need an office visit for further refills. Requested Prescriptions  Pending Prescriptions Disp Refills   losartan (COZAAR) 25 MG tablet [Pharmacy Med Name: LOSARTAN POTASSIUM 25 MG TAB] 30 tablet 0    Sig: TAKE 1 TABLET (25 MG TOTAL) BY MOUTH DAILY.     Cardiovascular:  Angiotensin Receptor Blockers Failed - 07/05/2023  8:41 AM      Failed - Cr in normal range and within 180 days    Creat  Date Value Ref Range Status  11/21/2021 0.77 0.70 - 1.35 mg/dL Final   Creatinine, Ser  Date Value Ref Range Status  08/24/2022 0.85 0.61 - 1.24 mg/dL Final         Failed - K in normal range and within 180 days    Potassium  Date Value Ref Range Status  08/24/2022 4.0 3.5 - 5.1 mmol/L Final         Failed - Valid encounter within last 6 months    Recent Outpatient Visits           7 months ago Essential hypertension   [facility] Margarita Mail, DO   10 months ago Hospital discharge follow-up   [facility] Margarita Mail, DO   1 year ago Primary hypertension   [facility] Margarita Mail, DO   1 year ago Annual physical exam   [facility] Margarita Mail, DO   2 years ago Hyperlipidemia with low HDL   [facility] Danelle Berry, [place]              Passed - Patient is not pregnant      Passed - Last BP in normal range    BP Readings from Last 1 Encounters:  11/24/22 116/62

## 2023-07-06 NOTE — Telephone Encounter (Signed)
Called pt - left message for pt to return call and schedule appt.

## 2023-07-08 ENCOUNTER — Telehealth: Payer: Self-pay

## 2023-07-08 ENCOUNTER — Telehealth: Payer: Self-pay | Admitting: *Deleted

## 2023-07-08 ENCOUNTER — Other Ambulatory Visit: Payer: Self-pay

## 2023-07-08 ENCOUNTER — Ambulatory Visit (INDEPENDENT_AMBULATORY_CARE_PROVIDER_SITE_OTHER): Payer: Medicare Other | Admitting: Internal Medicine

## 2023-07-08 VITALS — BP 132/66 | HR 82 | Temp 98.3°F | Resp 16 | Ht 69.0 in | Wt 178.2 lb

## 2023-07-08 DIAGNOSIS — E782 Mixed hyperlipidemia: Secondary | ICD-10-CM

## 2023-07-08 DIAGNOSIS — Z122 Encounter for screening for malignant neoplasm of respiratory organs: Secondary | ICD-10-CM | POA: Diagnosis not present

## 2023-07-08 DIAGNOSIS — R195 Other fecal abnormalities: Secondary | ICD-10-CM

## 2023-07-08 DIAGNOSIS — R7303 Prediabetes: Secondary | ICD-10-CM

## 2023-07-08 DIAGNOSIS — I10 Essential (primary) hypertension: Secondary | ICD-10-CM

## 2023-07-08 DIAGNOSIS — Z1211 Encounter for screening for malignant neoplasm of colon: Secondary | ICD-10-CM

## 2023-07-08 DIAGNOSIS — Z748 Other problems related to care provider dependency: Secondary | ICD-10-CM | POA: Diagnosis not present

## 2023-07-08 LAB — POCT GLYCOSYLATED HEMOGLOBIN (HGB A1C): Hemoglobin A1C: 6.3 % — AB (ref 4.0–5.6)

## 2023-07-08 MED ORDER — NA SULFATE-K SULFATE-MG SULF 17.5-3.13-1.6 GM/177ML PO SOLN: 1.0000 | Freq: Once | ORAL | 0 refills | Status: AC

## 2023-07-08 MED ORDER — LOSARTAN POTASSIUM 25 MG PO TABS: 25.0000 mg | ORAL_TABLET | Freq: Every day | ORAL | 1 refills | Status: DC

## 2023-07-08 MED ORDER — ATORVASTATIN CALCIUM 80 MG PO TABS: 80.0000 mg | ORAL_TABLET | Freq: Every day | ORAL | 1 refills | Status: DC

## 2023-07-08 NOTE — Telephone Encounter (Signed)
   Telephone encounter was:  Successful.  07/08/2023 Name: Travis Watson MRN: [id number] DOB: [birth year]  Travis Watson is a 67 y.o. year old male who is a primary care patient of Margarita Mail, DO . The community resource team was consulted for assistance with Transportation Needs  Colonscophy is not until september so patient will cill the thn line in august to schedule  Care guide performed the following interventions: Patient provided with information about care guide support team and interviewed to confirm resource needs.  Follow Up Plan nothing more needed  Dshawn Mcnay [facility] [place], Population Health [phone] [address] [place] , [place] [address] Email : [email or web address]

## 2023-07-08 NOTE — Progress Notes (Signed)
Established Patient Office Visit  Subjective   Patient ID: Travis Watson, male    DOB: 22-Nov-1956  Age: 67 y.o. MRN: [id number]  Chief Complaint  Patient presents with   Follow-up   Medication Refill    HPI  Patient is here today for follow up on chronic medical conditions. He's been doing well since our last office visit and has no questions or concerns.  Hypertension: -Medications: Losartan 25 mg  -Patient is compliant with above medications and reports no side effects. -Checking BP at home (average): not checking, has been controlled for years -Denies any SOB, CP, vision changes, LE edema or symptoms of hypotension  HLD/history of CVA: -Medications: Lipitor 80 mg, aspirin 81 mg -Patient is compliant with above medications and reports no side effects.  -Last lipid panel: Lipid Panel     Component Value Date/Time   CHOL 131 08/25/2022 0504   CHOL 153 06/25/2016 0847   TRIG 79 08/25/2022 0504   HDL 28 (L) 08/25/2022 0504   HDL 31 (L) 06/25/2016 0847   CHOLHDL 4.7 08/25/2022 0504   VLDL 16 08/25/2022 0504   LDLCALC 87 08/25/2022 0504   LDLCALC 66 11/21/2021 1008   LABVLDL 23 06/25/2016 0847   Pre-Diabetes: -Last A1c 5.9, 9/23 -Trying to watch diet, cut out breads and most carbs since CVA in September, eating lean proteins as well -Extensive family history of diabetes   Health Maintenance: -Blood work UTD -Due for Tdap, Prevnar 20 which he politely declines today -Colon cancer screening: cologuard positive; had a colonoscopy scheduled but Neurology wanted him to wait at least 6 months after stroke for procedure. Now is worried about transportation issues because his friend who helps him is sick with cancer.    Review of Systems  Constitutional:  Negative for chills and fever.  Eyes:  Negative for blurred vision.  Respiratory:  Negative for shortness of breath.   Cardiovascular:  Negative for chest pain.  Gastrointestinal:  Negative for abdominal pain.   Neurological:  Negative for headaches.      Objective:     BP 132/66   Pulse 82   Temp 98.3 F (36.8 C)   Resp 16   Ht 5\' 9"  (1.753 m)   Wt 178 lb 3.2 oz (80.8 kg)   SpO2 97%   BMI 26.32 kg/m  BP Readings from Last 3 Encounters:  07/08/23 132/66  11/24/22 116/62  08/29/22 (!) 156/94   Wt Readings from Last 3 Encounters:  07/08/23 178 lb 3.2 oz (80.8 kg)  06/19/23 181 lb (82.1 kg)  11/24/22 181 lb 11.2 oz (82.4 kg)      Physical Exam Constitutional:      Appearance: Normal appearance.  HENT:     Head: Normocephalic and atraumatic.  Eyes:     Conjunctiva/sclera: Conjunctivae normal.  Neck:     Vascular: No carotid bruit.  Cardiovascular:     Rate and Rhythm: Normal rate and regular rhythm.  Pulmonary:     Effort: Pulmonary effort is normal.     Breath sounds: Normal breath sounds.  Skin:    General: Skin is warm and dry.  Neurological:     General: No focal deficit present.     Mental Status: He is alert. Mental status is at baseline.  Psychiatric:        Mood and Affect: Mood normal.        Behavior: Behavior normal.      No results found for any visits on 07/08/23.  Last CBC Lab Results  Component Value Date   WBC 13.2 (H) 08/24/2022   HGB 15.1 08/24/2022   HCT 45.4 08/24/2022   MCV 97.4 08/24/2022   MCH 32.4 08/24/2022   RDW 12.2 08/24/2022   PLT 250 08/24/2022   Last metabolic panel Lab Results  Component Value Date   GLUCOSE 124 (H) 08/24/2022   NA 137 08/24/2022   K 4.0 08/24/2022   CL 106 08/24/2022   CO2 23 08/24/2022   BUN 14 08/24/2022   CREATININE 0.85 08/24/2022   EGFR 100 11/21/2021   CALCIUM 8.8 (L) 08/24/2022   PROT 6.7 11/21/2021   ALBUMIN 4.2 01/22/2017   LABGLOB 2.9 06/25/2016   AGRATIO 1.5 06/25/2016   BILITOT 0.6 11/21/2021   ALKPHOS 53 01/22/2017   AST 18 11/21/2021   ALT 22 11/21/2021   ANIONGAP 8 08/24/2022   Last lipids Lab Results  Component Value Date   CHOL 131 08/25/2022   HDL 28 (L) 08/25/2022    LDLCALC 87 08/25/2022   TRIG 79 08/25/2022   CHOLHDL 4.7 08/25/2022   Last hemoglobin A1c Lab Results  Component Value Date   HGBA1C 5.9 (H) 08/24/2022   Last thyroid functions Lab Results  Component Value Date   TSH 2.520 08/24/2022   Last vitamin D No results found for: "25OHVITD2", "25OHVITD3", "VD25OH" Last vitamin B12 and Folate No results found for: "VITAMINB12", "FOLATE"    The ASCVD Risk score (Arnett DK, et al., 2019) failed to calculate for the following reasons:   The patient has a prior MI or stroke diagnosis    Assessment & Plan:   1. Essential hypertension: Blood pressure continues to be well controlled, continue Losartan 25 mg, refilled.  - losartan (COZAAR) 25 MG tablet; Take 1 tablet (25 mg total) by mouth daily.  Dispense: 90 tablet; Refill: 1  2. Mixed hyperlipidemia: Stable, LDL well controlled on last lipid panel. Continue Lipitor 80 mg, refilled. Continue aspirin.  - atorvastatin (LIPITOR) 80 MG tablet; Take 1 tablet (80 mg total) by mouth daily.  Dispense: 90 tablet; Refill: 1  3. Prediabetes: A1c today increased at 6.3%, despite diet changes, does have an extensive family history of diabetes. Recheck at follow up.  - POCT HgB A1C  4. Positive colorectal cancer screening using Cologuard test/Colon cancer screening/Assistance with transportation: Positive cologuard in 6/23, referral to GI placed now that he is 6 months out from CVA. His friend who drives him is sick, worried about transportation, will place CCM referral to help with arranging transportation.   - Ambulatory referral to Gastroenterology - AMB Referral to [facility] ([facility] Patients)  5. Screening for lung cancer: Due, ordered. Patient still smoking, will pick up nicotine patches.   - Ambulatory Referral Lung Cancer Screening [facility]   Return in about 3 months (around 10/08/2023).    Margarita Mail, DO

## 2023-07-08 NOTE — Telephone Encounter (Signed)
Gastroenterology Pre-Procedure Review  Request Date: 08/28/23 Requesting Physician: Dr. Allegra Lai  PATIENT REVIEW QUESTIONS: The patient responded to the following health history questions as indicated:    1. Are you having any GI issues? no 2. Do you have a personal history of Polyps? no 3. Do you have a family history of Colon Cancer or Polyps? no 4. Diabetes Mellitus? no 5. Joint replacements in the past 12 months?no 6. Major health problems in the past 3 months? Last year pt had heart attack.  Cardiac clearance has been sent [facility] 7. Any artificial heart valves, MVP, or defibrillator?no    MEDICATIONS & ALLERGIES:    Patient reports the following regarding taking any anticoagulation/antiplatelet therapy:   Plavix, Coumadin, Eliquis, Xarelto, Lovenox, Pradaxa, Brilinta, or Effient? no Aspirin? yes (81 mg daily)  Patient confirms/reports the following medications:  Current Outpatient Medications  Medication Sig Dispense Refill   aspirin EC 81 MG tablet Take 1 tablet (81 mg total) by mouth daily. Swallow whole. 30 tablet 12   atorvastatin (LIPITOR) 80 MG tablet Take 1 tablet (80 mg total) by mouth daily. 90 tablet 1   fluocinonide cream (LIDEX) 0.05 % APPLY ON THE SKIN TWICE A DAY AS NEEDED  6   losartan (COZAAR) 25 MG tablet Take 1 tablet (25 mg total) by mouth daily. 90 tablet 1   No current facility-administered medications for this visit.    Patient confirms/reports the following allergies:  No Known Allergies  No orders of the defined types were placed in this encounter.   AUTHORIZATION INFORMATION Primary Insurance: 1D#: Group #:  Secondary Insurance: 1D#: Group #:  SCHEDULE INFORMATION: Date: 08/28/23 Time: Location: [place]

## 2023-07-28 DIAGNOSIS — R002 Palpitations: Secondary | ICD-10-CM | POA: Diagnosis not present

## 2023-07-28 DIAGNOSIS — I10 Essential (primary) hypertension: Secondary | ICD-10-CM | POA: Diagnosis not present

## 2023-07-28 DIAGNOSIS — Z01818 Encounter for other preprocedural examination: Secondary | ICD-10-CM | POA: Diagnosis not present

## 2023-08-27 ENCOUNTER — Encounter: Payer: Self-pay | Admitting: Gastroenterology

## 2023-08-28 ENCOUNTER — Ambulatory Visit
Admission: RE | Admit: 2023-08-28 | Discharge: 2023-08-28 | Disposition: A | Discharge status: Home / Self Care | Payer: Medicare Other | Attending: Gastroenterology | Admitting: Gastroenterology

## 2023-08-28 ENCOUNTER — Ambulatory Visit: Payer: Medicare Other | Admitting: Anesthesiology

## 2023-08-28 ENCOUNTER — Encounter
Admission: RE | Disposition: A | Discharge status: Home / Self Care | Payer: Self-pay | Source: Home / Self Care | Attending: Gastroenterology

## 2023-08-28 DIAGNOSIS — I110 Hypertensive heart disease with heart failure: Secondary | ICD-10-CM | POA: Insufficient documentation

## 2023-08-28 DIAGNOSIS — D120 Benign neoplasm of cecum: Secondary | ICD-10-CM | POA: Diagnosis not present

## 2023-08-28 DIAGNOSIS — I509 Heart failure, unspecified: Secondary | ICD-10-CM | POA: Diagnosis not present

## 2023-08-28 DIAGNOSIS — D123 Benign neoplasm of transverse colon: Secondary | ICD-10-CM

## 2023-08-28 DIAGNOSIS — Z8673 Personal history of transient ischemic attack (TIA), and cerebral infarction without residual deficits: Secondary | ICD-10-CM | POA: Insufficient documentation

## 2023-08-28 DIAGNOSIS — K635 Polyp of colon: Secondary | ICD-10-CM | POA: Diagnosis not present

## 2023-08-28 DIAGNOSIS — R195 Other fecal abnormalities: Secondary | ICD-10-CM

## 2023-08-28 DIAGNOSIS — F17210 Nicotine dependence, cigarettes, uncomplicated: Secondary | ICD-10-CM | POA: Insufficient documentation

## 2023-08-28 DIAGNOSIS — Z8249 Family history of ischemic heart disease and other diseases of the circulatory system: Secondary | ICD-10-CM | POA: Diagnosis not present

## 2023-08-28 DIAGNOSIS — Z1211 Encounter for screening for malignant neoplasm of colon: Secondary | ICD-10-CM | POA: Diagnosis not present

## 2023-08-28 DIAGNOSIS — D125 Benign neoplasm of sigmoid colon: Secondary | ICD-10-CM | POA: Diagnosis not present

## 2023-08-28 DIAGNOSIS — J449 Chronic obstructive pulmonary disease, unspecified: Secondary | ICD-10-CM | POA: Insufficient documentation

## 2023-08-28 DIAGNOSIS — I5032 Chronic diastolic (congestive) heart failure: Secondary | ICD-10-CM | POA: Diagnosis not present

## 2023-08-28 HISTORY — PX: POLYPECTOMY: SHX5525

## 2023-08-28 HISTORY — PX: COLONOSCOPY WITH PROPOFOL: SHX5780

## 2023-08-28 SURGERY — COLONOSCOPY WITH PROPOFOL
Anesthesia: General

## 2023-08-28 MED ORDER — LIDOCAINE HCL (CARDIAC) PF 100 MG/5ML IV SOSY
PREFILLED_SYRINGE | INTRAVENOUS | Status: DC | PRN
  Administered 2023-08-28: 50 mg via INTRAVENOUS

## 2023-08-28 MED ORDER — SODIUM CHLORIDE 0.9 % IV SOLN
INTRAVENOUS | Status: DC
  Administered 2023-08-28: 20 mL/h via INTRAVENOUS

## 2023-08-28 MED ORDER — PROPOFOL 10 MG/ML IV BOLUS
INTRAVENOUS | Status: DC | PRN
  Administered 2023-08-28 (×2): 70 mg via INTRAVENOUS

## 2023-08-28 MED ORDER — PROPOFOL 500 MG/50ML IV EMUL
INTRAVENOUS | Status: DC | PRN
  Administered 2023-08-28: 100 ug/kg/min via INTRAVENOUS

## 2023-08-28 NOTE — Transfer of Care (Signed)
Immediate Anesthesia Transfer of Care Note  Patient: Travis Watson  Procedure(s) Performed: COLONOSCOPY WITH PROPOFOL POLYPECTOMY  Patient Location: PACU  Anesthesia Type:MAC  Level of Consciousness: drowsy  Airway & Oxygen Therapy: Patient Spontanous Breathing  Post-op Assessment: Report given to RN and Post -op Vital signs reviewed and stable  Post vital signs: stable  Last Vitals:  Vitals Value Taken Time  BP 110/66 08/28/23 0821  Temp    Pulse 80 08/28/23 0821  Resp 13 08/28/23 0821  SpO2 99 % 08/28/23 0821    Last Pain:  Vitals:   08/28/23 0821  TempSrc:   PainSc: Asleep         Complications: No notable events documented.

## 2023-08-28 NOTE — Anesthesia Preprocedure Evaluation (Signed)
Anesthesia Evaluation  Patient identified by MRN, date of birth, ID band Patient awake    Reviewed: Allergy & Precautions, NPO status , Patient's Chart, lab work & pertinent test results  History of Anesthesia Complications Negative for: history of anesthetic complications  Airway Mallampati: II  TM Distance: >3 FB Neck ROM: Full    Dental no notable dental hx. (+) Teeth Intact   Pulmonary neg sleep apnea, COPD, Current SmokerPatient did not abstain from smoking.   Pulmonary exam normal breath sounds clear to auscultation       Cardiovascular Exercise Tolerance: Good METShypertension, +CHF  (-) CAD and (-) Past MI (-) dysrhythmias  Rhythm:Regular Rate:Normal - Systolic murmurs    Neuro/Psych Stroke last year, on baby aspirin only. Residual occasional numbness in left hand CVA, Residual Symptoms  negative psych ROS   GI/Hepatic ,neg GERD  ,,(+)     (-) substance abuse    Endo/Other  neg diabetes    Renal/GU negative Renal ROS     Musculoskeletal   Abdominal   Peds  Hematology   Anesthesia Other Findings Past Medical History: No date: Hyperlipidemia No date: Hypertension 08/13/2018: Paraseptal emphysema (HCC)  Reproductive/Obstetrics                             Anesthesia Physical Anesthesia Plan  ASA: 3  Anesthesia Plan: General   Post-op Pain Management: Minimal or no pain anticipated   Induction: Intravenous  PONV Risk Score and Plan: 1 and Propofol infusion, TIVA and Ondansetron  Airway Management Planned: Nasal Cannula  Additional Equipment: None  Intra-op Plan:   Post-operative Plan:   Informed Consent: I have reviewed the patients History and Physical, chart, labs and discussed the procedure including the risks, benefits and alternatives for the proposed anesthesia with the patient or authorized representative who has indicated his/her understanding and acceptance.      Dental advisory given  Plan Discussed with: CRNA and Surgeon  Anesthesia Plan Comments: (Discussed risks of anesthesia with patient, including possibility of difficulty with spontaneous ventilation under anesthesia necessitating airway intervention, PONV, and rare risks such as cardiac or respiratory or neurological events, and allergic reactions. Discussed the role of CRNA in patient's perioperative care. Patient understands. Patient counseled on benefits of smoking cessation, and increased perioperative risks associated with continued smoking. )       Anesthesia Quick Evaluation

## 2023-08-28 NOTE — Anesthesia Postprocedure Evaluation (Signed)
Anesthesia Post Note  Patient: Travis Watson  Procedure(s) Performed: COLONOSCOPY WITH PROPOFOL POLYPECTOMY  Patient location during evaluation: Endoscopy Anesthesia Type: General Level of consciousness: awake and alert Pain management: pain level controlled Vital Signs Assessment: post-procedure vital signs reviewed and stable Respiratory status: spontaneous breathing, nonlabored ventilation, respiratory function stable and patient connected to nasal cannula oxygen Cardiovascular status: blood pressure returned to baseline and stable Postop Assessment: no apparent nausea or vomiting Anesthetic complications: no   No notable events documented.   Last Vitals:  Vitals:   08/28/23 0638 08/28/23 0821  BP: 123/89 110/66  Pulse: 94 80  Resp: 20 13  Temp: (!) 35.8 C (!) 35.7 C  SpO2: 100% 99%    Last Pain:  Vitals:   08/28/23 0821  TempSrc: Temporal  PainSc: Asleep                 Corinda Gubler

## 2023-08-28 NOTE — H&P (Signed)
Travis Repress, MD [address]  [address]  [place], [place] [address]  Main: [phone]  Fax: [phone] Pager: [phone]  Primary Care Physician:  Travis Mail, DO Primary Gastroenterologist:  Dr. Arlyss Watson  Pre-Procedure History & Physical: HPI:  Travis Watson is a 67 y.o. male is here for an colonoscopy.   Past Medical History:  Diagnosis Date   Hyperlipidemia    Hypertension    Paraseptal emphysema (HCC) 08/13/2018    History reviewed. No pertinent surgical history.  Prior to Admission medications   Medication Sig Start Date End Date Taking? Authorizing Provider  aspirin EC 81 MG tablet Take 1 tablet (81 mg total) by mouth daily. Swallow whole. 11/24/22  Yes Travis Mail, DO  atorvastatin (LIPITOR) 80 MG tablet Take 1 tablet (80 mg total) by mouth daily. 07/08/23  Yes Travis Mail, DO  fluocinonide cream (LIDEX) 0.05 % APPLY ON THE SKIN TWICE A DAY AS NEEDED 10/06/18  Yes [provider]  losartan (COZAAR) 25 MG tablet Take 1 tablet (25 mg total) by mouth daily. 07/08/23  Yes Travis Mail, DO    Allergies as of 07/08/2023   (No Known Allergies)    Family History  Problem Relation Age of Onset   Diabetes Mother    Emphysema Mother    Hyperlipidemia Father    Cancer Father        prostate   Heart disease Maternal Grandmother    Hypertension Maternal Grandmother    Heart disease Maternal Grandfather    Hypertension Maternal Grandfather    Migraines Paternal Grandmother     Social History   Socioeconomic History   Marital status: Single    Spouse name: Not on file   Number of children: Not on file   Years of education: Not on file   Highest education level: Associate degree: occupational, Scientist, product/process development, or vocational program  Occupational History   Not on file  Tobacco Use   Smoking status: Every Day    Current packs/day: 1.00    Average packs/day: 1 pack/day for 49.0 years (49.0 ttl pk-yrs)    Types:  Cigarettes   Smokeless tobacco: Former    Types: Snuff   Tobacco comments:    quit smokeless tabacco about 40 years ago  Vaping Use   Vaping status: Never Used  Substance and Sexual Activity   Alcohol use: Yes    Alcohol/week: 0.0 standard drinks of alcohol    Comment: occasional   Drug use: No   Sexual activity: Not Currently  Other Topics Concern   Not on file  Social History Narrative   Not on file   Social Determinants of Health   Financial Resource Strain: Low Risk  (07/08/2023)   Overall Financial Resource Strain (CARDIA)    Difficulty of Paying Living Expenses: Not hard at all  Food Insecurity: No Food Insecurity (07/08/2023)   Hunger Vital Sign    Worried About Running Out of Food in the Last Year: Never true    Ran Out of Food in the Last Year: Never true  Transportation Needs: No Transportation Needs (07/08/2023)   PRAPARE - Administrator, Civil Service (Medical): No    Lack of Transportation (Non-Medical): No  Physical Activity: Insufficiently Active (07/08/2023)   Exercise Vital Sign    Days of Exercise per Week: 3 days    Minutes of Exercise per Session: 30 min  Stress: No Stress Concern Present (07/08/2023)   [employer] of Occupational Health -  Occupational Stress Questionnaire    Feeling of Stress : Not at all  Social Connections: Socially Isolated (07/08/2023)   Social Connection and Isolation Panel [NHANES]    Frequency of Communication with Friends and Family: Twice a week    Frequency of Social Gatherings with Friends and Family: Once a week    Attends Religious Services: Never    Database administrator or Organizations: No    Attends Banker Meetings: Never    Marital Status: Divorced  Catering manager Violence: Not At Risk (06/19/2023)   Humiliation, Afraid, Rape, and Kick questionnaire    Fear of Current or Ex-Partner: No    Emotionally Abused: No    Physically Abused: No    Sexually Abused: No    Review of  Systems: See HPI, otherwise negative ROS  Physical Exam: BP 123/89   Pulse 94   Temp (!) 96.4 F (35.8 C) (Temporal)   Resp 20   Ht 5\' 9"  (1.753 m)   Wt 77.3 kg   SpO2 100%   BMI 25.16 kg/m  General:   Alert,  pleasant and cooperative in NAD Head:  Normocephalic and atraumatic. Neck:  Supple; no masses or thyromegaly. Lungs:  Clear throughout to auscultation.    Heart:  Regular rate and rhythm. Abdomen:  Soft, nontender and nondistended. Normal bowel sounds, without guarding, and without rebound.   Neurologic:  Alert and  oriented x4;  grossly normal neurologically.  Impression/Plan: Travis Watson is here for an colonoscopy to be performed for cologuard positive  Risks, benefits, limitations, and alternatives regarding  colonoscopy have been reviewed with the patient.  Questions have been answered.  All parties agreeable.   Lannette Donath, MD  08/28/2023, 7:53 AM

## 2023-08-28 NOTE — Op Note (Signed)
[facility] Gastroenterology Patient Name: Travis Watson Procedure Date: 08/28/2023 7:08 AM MRN: [id number] Account #: [id number] Date of Birth: 1956-02-02 Admit Type: Outpatient Age: 67 Room: [facility] ENDO ROOM 2 Gender: Male Note Status: Finalized Instrument Name: Colonoscope [id number] Procedure:             Colonoscopy Indications:           Last colonoscopy: May 2014, Positive Cologuard test Providers:             Toney Reil MD, MD Referring MD:          Margarita Mail (Referring MD) Medicines:             General Anesthesia Complications:         No immediate complications. Estimated blood loss: None. Procedure:             Pre-Anesthesia Assessment:                        - Prior to the procedure, a History and Physical was                         performed, and patient medications and allergies were                         reviewed. The patient is competent. The risks and                         benefits of the procedure and the sedation options and                         risks were discussed with the patient. All questions                         were answered and informed consent was obtained.                         Patient identification and proposed procedure were                         verified by the physician, the nurse, the                         anesthesiologist, the anesthetist and the technician                         in the pre-procedure area in the procedure room in the                         endoscopy suite. Mental Status Examination: alert and                         oriented. Airway Examination: normal oropharyngeal                         airway and neck mobility. Respiratory Examination:                         clear to auscultation. CV Examination: normal.  Prophylactic Antibiotics: The patient does not require                         prophylactic antibiotics. Prior Anticoagulants: The                          patient has taken no anticoagulant or antiplatelet                         agents. ASA Grade Assessment: II - A patient with mild                         systemic disease. After reviewing the risks and                         benefits, the patient was deemed in satisfactory                         condition to undergo the procedure. The anesthesia                         plan was to use general anesthesia. Immediately prior                         to administration of medications, the patient was                         re-assessed for adequacy to receive sedatives. The                         heart rate, respiratory rate, oxygen saturations,                         blood pressure, adequacy of pulmonary ventilation, and                         response to care were monitored throughout the                         procedure. The physical status of the patient was                         re-assessed after the procedure.                        After obtaining informed consent, the colonoscope was                         passed under direct vision. Throughout the procedure,                         the patient's blood pressure, pulse, and oxygen                         saturations were monitored continuously. The                         Colonoscope was introduced through the anus and  advanced to the the terminal ileum, with                         identification of the appendiceal orifice and IC                         valve. The colonoscopy was performed without                         difficulty. The patient tolerated the procedure well.                         The quality of the bowel preparation was evaluated                         using the BBPS [facility] Bowel Preparation Scale) with                         scores of: Right Colon = 3, Transverse Colon = 3 and                         Left Colon = 3 (entire mucosa seen well with no                          residual staining, small fragments of stool or opaque                         liquid). The total BBPS score equals 9. The terminal                         ileum, ileocecal valve, appendiceal orifice, and                         rectum were photographed. Findings:      The perianal and digital rectal examinations were normal. Pertinent       negatives include normal sphincter tone and no palpable rectal lesions.      The terminal ileum appeared normal.      Four sessile polyps were found in the sigmoid colon, hepatic flexure and       cecum. The polyps were 3 to 7 mm in size. These polyps were removed with       a cold snare. Resection and retrieval were complete. Estimated blood       loss was minimal.      The retroflexed view of the distal rectum and anal verge was normal and       showed no anal or rectal abnormalities. Impression:            - The examined portion of the ileum was normal.                        - Four 3 to 7 mm polyps in the sigmoid colon, at the                         hepatic flexure and in the cecum, removed with a cold  snare. Resected and retrieved.                        - The distal rectum and anal verge are normal on                         retroflexion view. Recommendation:        - Discharge patient to home (with escort).                        - Resume previous diet today.                        - Continue present medications.                        - Await pathology results.                        - Repeat colonoscopy in 3 - 5 years for surveillance. Procedure Code(s):     --- Professional ---                        [phone], Colonoscopy, flexible; with removal of                         tumor(s), polyp(s), or other lesion(s) by snare                         technique Diagnosis Code(s):     --- Professional ---                        D12.5, Benign neoplasm of sigmoid colon                        D12.3, Benign neoplasm of transverse  colon (hepatic                         flexure or splenic flexure)                        D12.0, Benign neoplasm of cecum                        R19.5, Other fecal abnormalities CPT copyright 2022 American Medical Association. All rights reserved. The codes documented in this report are preliminary and upon coder review may  be revised to meet current compliance requirements. Dr. Libby Maw Toney Reil MD, MD 08/28/2023 8:22:56 AM This report has been signed electronically. Number of Addenda: 0 Note Initiated On: 08/28/2023 7:08 AM Scope Withdrawal Time: 0 hours 12 minutes 27 seconds  Total Procedure Duration: 0 hours 15 minutes 16 seconds  Estimated Blood Loss:  Estimated blood loss: none.      [facility]

## 2023-08-31 ENCOUNTER — Encounter: Payer: Self-pay | Admitting: Gastroenterology

## 2023-08-31 NOTE — Group Note (Deleted)

## 2023-09-24 ENCOUNTER — Telehealth: Payer: Self-pay

## 2023-09-24 NOTE — Telephone Encounter (Signed)
Returned call to patient at [phone] but no answer.  Left Vm to call back for scheduling annual LDCT

## 2023-09-25 ENCOUNTER — Other Ambulatory Visit: Payer: Self-pay | Admitting: Emergency Medicine

## 2023-09-25 DIAGNOSIS — Z122 Encounter for screening for malignant neoplasm of respiratory organs: Secondary | ICD-10-CM

## 2023-09-25 DIAGNOSIS — Z87891 Personal history of nicotine dependence: Secondary | ICD-10-CM

## 2023-09-25 DIAGNOSIS — F17210 Nicotine dependence, cigarettes, uncomplicated: Secondary | ICD-10-CM

## 2023-09-25 NOTE — Telephone Encounter (Signed)
Patient returned call. LDCT scheduled for pt on 10/14 at 4pm at [facility]. Patient is aware of appt time/date/location.

## 2023-10-05 ENCOUNTER — Ambulatory Visit
Admission: RE | Admit: 2023-10-05 | Discharge: 2023-10-05 | Disposition: A | Discharge status: Home / Self Care | Payer: Medicare Other | Source: Ambulatory Visit | Attending: Acute Care | Admitting: Acute Care

## 2023-10-05 DIAGNOSIS — Z87891 Personal history of nicotine dependence: Secondary | ICD-10-CM | POA: Diagnosis not present

## 2023-10-05 DIAGNOSIS — Z122 Encounter for screening for malignant neoplasm of respiratory organs: Secondary | ICD-10-CM | POA: Insufficient documentation

## 2023-10-05 DIAGNOSIS — F17210 Nicotine dependence, cigarettes, uncomplicated: Secondary | ICD-10-CM | POA: Insufficient documentation

## 2023-10-06 NOTE — Progress Notes (Unsigned)
Established Patient Office Visit  Subjective   Patient ID: Travis Watson, male    DOB: 1956-02-05  Age: 67 y.o. MRN: [id number]  No chief complaint on file.   HPI  Patient is here today for follow up on chronic medical conditions. He's been doing well since our last office visit and has no questions or concerns.  Hypertension: -Medications: Losartan 25 mg  -Patient is compliant with above medications and reports no side effects. -Checking BP at home (average): not checking, has been controlled for years -Denies any SOB, CP, vision changes, LE edema or symptoms of hypotension  HLD/history of CVA: -Medications: Lipitor 80 mg, aspirin 81 mg -Patient is compliant with above medications and reports no side effects.  -Last lipid panel: Lipid Panel     Component Value Date/Time   CHOL 131 08/25/2022 0504   CHOL 153 06/25/2016 0847   TRIG 79 08/25/2022 0504   HDL 28 (L) 08/25/2022 0504   HDL 31 (L) 06/25/2016 0847   CHOLHDL 4.7 08/25/2022 0504   VLDL 16 08/25/2022 0504   LDLCALC 87 08/25/2022 0504   LDLCALC 66 11/21/2021 1008   LABVLDL 23 06/25/2016 0847   Pre-Diabetes: -Last A1c 6.3% 7/24 -Trying to watch diet, cut out breads and most carbs since CVA in September, eating lean proteins as well -Extensive family history of diabetes   Health Maintenance: -Blood work due -Due for Tdap, Prevnar 20 which he politely declines today -Colon cancer screening: cologuard positive; had a colonoscopy scheduled but Neurology wanted him to wait at least 6 months after stroke for procedure. Now is worried about transportation issues because his friend who helps him is sick with cancer.    Review of Systems  Constitutional:  Negative for chills and fever.  Eyes:  Negative for blurred vision.  Respiratory:  Negative for shortness of breath.   Cardiovascular:  Negative for chest pain.  Gastrointestinal:  Negative for abdominal pain.  Neurological:  Negative for headaches.       Objective:     There were no vitals taken for this visit. BP Readings from Last 3 Encounters:  08/28/23 (!) 115/98  07/08/23 132/66  11/24/22 116/62   Wt Readings from Last 3 Encounters:  08/28/23 170 lb 6.4 oz (77.3 kg)  07/08/23 178 lb 3.2 oz (80.8 kg)  06/19/23 181 lb (82.1 kg)      Physical Exam Constitutional:      Appearance: Normal appearance.  HENT:     Head: Normocephalic and atraumatic.  Eyes:     Conjunctiva/sclera: Conjunctivae normal.  Neck:     Vascular: No carotid bruit.  Cardiovascular:     Rate and Rhythm: Normal rate and regular rhythm.  Pulmonary:     Effort: Pulmonary effort is normal.     Breath sounds: Normal breath sounds.  Skin:    General: Skin is warm and dry.  Neurological:     General: No focal deficit present.     Mental Status: He is alert. Mental status is at baseline.  Psychiatric:        Mood and Affect: Mood normal.        Behavior: Behavior normal.      No results found for any visits on 10/08/23.  Last CBC Lab Results  Component Value Date   WBC 13.2 (H) 08/24/2022   HGB 15.1 08/24/2022   HCT 45.4 08/24/2022   MCV 97.4 08/24/2022   MCH 32.4 08/24/2022   RDW 12.2 08/24/2022   PLT 250  08/24/2022   Last metabolic panel Lab Results  Component Value Date   GLUCOSE 124 (H) 08/24/2022   NA 137 08/24/2022   K 4.0 08/24/2022   CL 106 08/24/2022   CO2 23 08/24/2022   BUN 14 08/24/2022   CREATININE 0.85 08/24/2022   EGFR 100 11/21/2021   CALCIUM 8.8 (L) 08/24/2022   PROT 6.7 11/21/2021   ALBUMIN 4.2 01/22/2017   LABGLOB 2.9 06/25/2016   AGRATIO 1.5 06/25/2016   BILITOT 0.6 11/21/2021   ALKPHOS 53 01/22/2017   AST 18 11/21/2021   ALT 22 11/21/2021   ANIONGAP 8 08/24/2022   Last lipids Lab Results  Component Value Date   CHOL 131 08/25/2022   HDL 28 (L) 08/25/2022   LDLCALC 87 08/25/2022   TRIG 79 08/25/2022   CHOLHDL 4.7 08/25/2022   Last hemoglobin A1c Lab Results  Component Value Date   HGBA1C 6.3 (A)  07/08/2023   Last thyroid functions Lab Results  Component Value Date   TSH 2.520 08/24/2022   Last vitamin D No results found for: "25OHVITD2", "25OHVITD3", "VD25OH" Last vitamin B12 and Folate No results found for: "VITAMINB12", "FOLATE"    The ASCVD Risk score (Arnett DK, et al., 2019) failed to calculate for the following reasons:   The patient has a prior MI or stroke diagnosis    Assessment & Plan:   1. Essential hypertension: Blood pressure continues to be well controlled, continue Losartan 25 mg, refilled.  - losartan (COZAAR) 25 MG tablet; Take 1 tablet (25 mg total) by mouth daily.  Dispense: 90 tablet; Refill: 1  2. Mixed hyperlipidemia: Stable, LDL well controlled on last lipid panel. Continue Lipitor 80 mg, refilled. Continue aspirin.  - atorvastatin (LIPITOR) 80 MG tablet; Take 1 tablet (80 mg total) by mouth daily.  Dispense: 90 tablet; Refill: 1  3. Prediabetes: A1c today increased at 6.3%, despite diet changes, does have an extensive family history of diabetes. Recheck at follow up.  - POCT HgB A1C  4. Positive colorectal cancer screening using Cologuard test/Colon cancer screening/Assistance with transportation: Positive cologuard in 6/23, referral to GI placed now that he is 6 months out from CVA. His friend who drives him is sick, worried about transportation, will place CCM referral to help with arranging transportation.   - Ambulatory referral to Gastroenterology - AMB Referral to [facility] ([facility])  5. Screening for lung cancer: Due, ordered. Patient still smoking, will pick up nicotine patches.   - Ambulatory Referral Lung Cancer Screening [facility]   No follow-ups on file.    Margarita Mail, DO

## 2023-10-08 ENCOUNTER — Encounter: Payer: Self-pay | Admitting: Internal Medicine

## 2023-10-08 ENCOUNTER — Ambulatory Visit (INDEPENDENT_AMBULATORY_CARE_PROVIDER_SITE_OTHER): Payer: Medicare Other | Admitting: Internal Medicine

## 2023-10-08 VITALS — BP 130/78 | HR 90 | Temp 98.1°F | Resp 18 | Ht 69.0 in | Wt 180.5 lb

## 2023-10-08 DIAGNOSIS — Z125 Encounter for screening for malignant neoplasm of prostate: Secondary | ICD-10-CM

## 2023-10-08 DIAGNOSIS — R251 Tremor, unspecified: Secondary | ICD-10-CM | POA: Diagnosis not present

## 2023-10-08 DIAGNOSIS — I10 Essential (primary) hypertension: Secondary | ICD-10-CM | POA: Diagnosis not present

## 2023-10-08 DIAGNOSIS — E782 Mixed hyperlipidemia: Secondary | ICD-10-CM

## 2023-10-08 DIAGNOSIS — R7303 Prediabetes: Secondary | ICD-10-CM

## 2023-10-08 DIAGNOSIS — E559 Vitamin D deficiency, unspecified: Secondary | ICD-10-CM | POA: Diagnosis not present

## 2023-10-08 MED ORDER — ATORVASTATIN CALCIUM 80 MG PO TABS
80.0000 mg | ORAL_TABLET | Freq: Every day | ORAL | 1 refills | Status: DC
Start: 2023-10-08 — End: 2024-04-22

## 2023-10-08 MED ORDER — LOSARTAN POTASSIUM 25 MG PO TABS
25.0000 mg | ORAL_TABLET | Freq: Every day | ORAL | 1 refills | Status: DC
Start: 2023-10-08 — End: 2024-05-02

## 2023-10-08 MED ORDER — PROPRANOLOL HCL ER 60 MG PO CP24: 60.0000 mg | ORAL_CAPSULE | Freq: Every day | ORAL | 0 refills | Status: DC

## 2023-10-08 NOTE — Patient Instructions (Addendum)
It was great seeing you today!  Plan discussed at today's visit: -Blood work ordered today, results will be uploaded to MyChart.  -Try Propanolol for tremor  Follow up in: 1 month   Take care and let us know if you have any questions or concerns prior to your next visit.  Dr. Caralee Ates  Essential Tremor A tremor is trembling or shaking that a person cannot control. Most tremors affect the hands or arms. Tremors can also affect the head, vocal cords, legs, and other parts of the body. Essential tremor is a tremor without a known cause. Usually, it occurs while a person is trying to perform an action. It tends to get worse gradually as a person ages. What are the causes? The cause of this condition is not known, but it often runs in families. What increases the risk? You are more likely to develop this condition if: You have a family member with essential tremor. You are 66 years of age or older. What are the signs or symptoms? The main sign of a tremor is a rhythmic shaking of certain parts of your body that is uncontrolled and unintentional. You may: Have difficulty eating with a spoon or fork. Have difficulty writing. Nod your head up and down or side to side. Have a quivering voice. The shaking may: Get worse over time. Come and go. Be more noticeable on one side of your body. Get worse due to stress, tiredness (fatigue), caffeine, and extreme heat or cold. How is this diagnosed? This condition may be diagnosed based on: Your symptoms and medical history. A physical exam. There is no single test to diagnose an essential tremor. However, your health care provider may order tests to rule out other causes of your condition. These may include: Blood and urine tests. Imaging studies of your brain, such as a CT scan or MRI. How is this treated? Treatment for essential tremor depends on the severity of the condition. Mild tremors may not need treatment if they do not affect your  day-to-day life. Severe tremors may need to be treated using one or more of the following options: Medicines. Injections of a substance called botulinum toxin. Procedures such as deep brain stimulation (DBS) implantation or MRI-guided ultrasound treatment. Lifestyle changes. Occupational or physical therapy. Follow these instructions at home: Lifestyle  Do not use any products that contain nicotine or tobacco. These products include cigarettes, chewing tobacco, and vaping devices, such as e-cigarettes. If you need help quitting, ask your health care provider. Limit your caffeine intake as told by your health care provider. Try to get 8 hours of sleep each night. Find ways to manage your stress that fit your lifestyle and personality. Consider trying meditation or yoga. Try to anticipate stressful situations and allow extra time to manage them. If you are struggling emotionally with the effects of your tremor, consider working with a mental health provider. General instructions Take over-the-counter and prescription medicines only as told by your health care provider. Avoid extreme heat and extreme cold. Keep all follow-up visits. This is important. Visits may include physical therapy visits. Where to find more information [employer] of Neurological Disorders and Stroke: [employer] Contact a health care provider if: You experience any changes in the location or intensity of your tremors. You start having a tremor after starting a new medicine. You have a tremor with other symptoms, such as: Numbness. Tingling. Pain. Weakness. Your tremor gets worse. Your tremor interferes with your daily life. You feel down, blue,  or sad for at least 2 weeks in a row. Worrying about your tremor and what other people think about you interferes with your everyday life functions, including relationships, work, or school. Summary Essential tremor is a tremor without a known cause.  Usually, it occurs when you are trying to perform an action. You are more likely to develop this condition if you have a family member with essential tremor. The main sign of a tremor is a rhythmic shaking of certain parts of your body that is uncontrolled and unintentional. Treatment for essential tremor depends on the severity of the condition. This information is not intended to replace advice given to you by your health care provider. Make sure you discuss any questions you have with your health care provider. Document Revised: 09/27/2021 Document Reviewed: 09/27/2021 Elsevier Patient Education  2024 [employer].

## 2023-10-09 LAB — CBC WITH DIFFERENTIAL/PLATELET
Absolute Lymphocytes: 2309 {cells}/uL (ref 850–3900)
Absolute Monocytes: 795 {cells}/uL (ref 200–950)
Basophils Absolute: 58 {cells}/uL (ref 0–200)
Basophils Relative: 0.6 %
Eosinophils Absolute: 146 {cells}/uL (ref 15–500)
Eosinophils Relative: 1.5 %
HCT: 44.7 % (ref 38.5–50.0)
Hemoglobin: 15.3 g/dL (ref 13.2–17.1)
MCH: 32.8 pg (ref 27.0–33.0)
MCHC: 34.2 g/dL (ref 32.0–36.0)
MCV: 95.9 fL (ref 80.0–100.0)
MPV: 10.6 fL (ref 7.5–12.5)
Monocytes Relative: 8.2 %
Neutro Abs: 6392 {cells}/uL (ref 1500–7800)
Neutrophils Relative %: 65.9 %
Platelets: 271 10*3/uL (ref 140–400)
RBC: 4.66 10*6/uL (ref 4.20–5.80)
RDW: 11.8 % (ref 11.0–15.0)
Total Lymphocyte: 23.8 %
WBC: 9.7 10*3/uL (ref 3.8–10.8)

## 2023-10-09 LAB — LIPID PANEL
Cholesterol: 102 mg/dL (ref <200)
HDL: 29 mg/dL — ABNORMAL LOW (ref >=40)
LDL Cholesterol (Calc): 58 mg/dL
Non-HDL Cholesterol (Calc): 73 mg/dL (ref <130)
Total CHOL/HDL Ratio: 3.5 (calc) (ref <5.0)
Triglycerides: 73 mg/dL (ref <150)

## 2023-10-09 LAB — COMPLETE METABOLIC PANEL WITH GFR
AG Ratio: 1.6 (calc) (ref 1.0–2.5)
ALT: 25 U/L (ref 9–46)
AST: 18 U/L (ref 10–35)
Albumin: 4.3 g/dL (ref 3.6–5.1)
Alkaline phosphatase (APISO): 70 U/L (ref 35–144)
BUN: 11 mg/dL (ref 7–25)
CO2: 27 mmol/L (ref 20–32)
Calcium: 9.4 mg/dL (ref 8.6–10.3)
Chloride: 104 mmol/L (ref 98–110)
Creat: 0.87 mg/dL (ref 0.70–1.35)
Globulin: 2.7 g/dL (ref 1.9–3.7)
Glucose, Bld: 104 mg/dL — ABNORMAL HIGH (ref 65–99)
Potassium: 4.9 mmol/L (ref 3.5–5.3)
Sodium: 139 mmol/L (ref 135–146)
Total Bilirubin: 0.6 mg/dL (ref 0.2–1.2)
Total Protein: 7 g/dL (ref 6.1–8.1)
eGFR: 95 mL/min/{1.73_m2} (ref >=60)

## 2023-10-09 LAB — HEMOGLOBIN A1C
Hgb A1c MFr Bld: 6.2 %{Hb} — ABNORMAL HIGH (ref <5.7)
Mean Plasma Glucose: 131 mg/dL
eAG (mmol/L): 7.3 mmol/L

## 2023-10-09 LAB — B12 AND FOLATE PANEL
Folate: 14.5 ng/mL
Vitamin B-12: 334 pg/mL (ref 200–1100)

## 2023-10-09 LAB — TSH: TSH: 2.83 m[IU]/L (ref 0.40–4.50)

## 2023-10-09 LAB — PSA: PSA: 0.84 ng/mL (ref <=4.00)

## 2023-10-09 LAB — VITAMIN D 25 HYDROXY (VIT D DEFICIENCY, FRACTURES): Vit D, 25-Hydroxy: 18 ng/mL — ABNORMAL LOW (ref 30–100)

## 2023-10-12 MED ORDER — VITAMIN D (ERGOCALCIFEROL) 1.25 MG (50000 UNIT) PO CAPS: 50000.0000 [IU] | ORAL_CAPSULE | ORAL | 0 refills | Status: DC

## 2023-10-12 NOTE — Addendum Note (Signed)
Addended by: Margarita Mail on: 10/12/2023 10:01 AM   Modules accepted: Orders

## 2023-10-22 ENCOUNTER — Telehealth: Payer: Self-pay | Admitting: Acute Care

## 2023-10-22 DIAGNOSIS — R911 Solitary pulmonary nodule: Secondary | ICD-10-CM

## 2023-10-22 DIAGNOSIS — Z87891 Personal history of nicotine dependence: Secondary | ICD-10-CM

## 2023-10-22 NOTE — Telephone Encounter (Signed)
Left VM for patient to call for results review on recent LDCT

## 2023-10-23 NOTE — Telephone Encounter (Signed)
Spoke to patient by phone to review results of LDCT.  Results are LR3 with one small pulmonary nodule.  No previous comparison imaging.  Recommendation to repeat LDCT in 6 months, as precaution, for nodule stability.  Atherosclerosis and mild calcifications in the thoracic aorta.  Patient is on statin medication.  Mild emphysema noted. Patient in agreement to repeat scan in 6 months.  Order placed.  Results/plan faxed to PCP.  Patient had no questions.

## 2023-10-23 NOTE — Addendum Note (Signed)
Addended by: Karlton Lemon on: 10/23/2023 11:13 AM   Modules accepted: Orders

## 2023-10-23 NOTE — Telephone Encounter (Signed)
Returned call to patient from VM.  No answer.  Left message

## 2023-11-04 NOTE — Progress Notes (Unsigned)
Established Patient Office Visit  Subjective   Patient ID: Travis Watson, male    DOB: [birth year]  Age: 67 y.o. MRN: [id number]  No chief complaint on file.   HPI  Patient is here today for follow up on chronic medical conditions. He wants to discuss a tremor in his bilateral hands that has been ongoing and progressively worsening over the last 6 months to a year. The tremor is mainly in his hands and occurs with action, like when he is writing or shaving. Noticed he is dropping things more frequently. Has not noticed any other neurologic symptoms or changes. His mother had a similar tremor that developed in her 35's. He was started   Hypertension: -Medications: Losartan 25 mg  -Patient is compliant with above medications and reports no side effects. -Checking BP at home (average): not checking, has been controlled for years -Denies any SOB, CP, vision changes, LE edema or symptoms of hypotension  HLD/history of CVA: -Medications: Lipitor 80 mg, aspirin 81 mg -Patient is compliant with above medications and reports no side effects.  -Last lipid panel: Lipid Panel     Component Value Date/Time   CHOL 102 10/08/2023 0832   CHOL 153 06/25/2016 0847   TRIG 73 10/08/2023 0832   HDL 29 (L) 10/08/2023 0832   HDL 31 (L) 06/25/2016 0847   CHOLHDL 3.5 10/08/2023 0832   VLDL 16 08/25/2022 0504   LDLCALC 58 10/08/2023 0832   LABVLDL 23 06/25/2016 0847   Pre-Diabetes: -Last A1c 6.3% 7/24 -Trying to watch diet, cut out breads and most carbs since CVA in September, eating lean proteins as well -Extensive family history of diabetes   Health Maintenance: -Blood work due -Due for Tdap, Prevnar 20 which he politely declines today -Colon cancer screening: cologuard positive; colonoscopy 9/24 with recommendations to repeat in 3-5 years.  -Lung cancer screening performed 10/24, still waiting for results    Review of Systems  Constitutional:  Negative for chills and fever.  Eyes:   Negative for blurred vision.  Respiratory:  Negative for shortness of breath.   Cardiovascular:  Negative for chest pain.  Gastrointestinal:  Negative for abdominal pain.  Neurological:  Positive for tremors. Negative for dizziness, weakness and headaches.      Objective:     There were no vitals taken for this visit. BP Readings from Last 3 Encounters:  10/08/23 130/78  08/28/23 (!) 115/98  07/08/23 132/66   Wt Readings from Last 3 Encounters:  10/08/23 180 lb 8 oz (81.9 kg)  08/28/23 170 lb 6.4 oz (77.3 kg)  07/08/23 178 lb 3.2 oz (80.8 kg)      Physical Exam Constitutional:      Appearance: Normal appearance.  HENT:     Head: Normocephalic and atraumatic.     Mouth/Throat:     Mouth: Mucous membranes are moist.     Pharynx: Oropharynx is clear.  Eyes:     Extraocular Movements: Extraocular movements intact.     Conjunctiva/sclera: Conjunctivae normal.     Pupils: Pupils are equal, round, and reactive to light.  Cardiovascular:     Rate and Rhythm: Normal rate and regular rhythm.  Pulmonary:     Effort: Pulmonary effort is normal.     Breath sounds: Normal breath sounds.  Skin:    General: Skin is warm and dry.  Neurological:     General: No focal deficit present.     Mental Status: He is alert. Mental status is at baseline.  Cranial Nerves: No cranial nerve deficit.     Sensory: No sensory deficit.     Motor: No weakness.     Coordination: Coordination normal.     Gait: Gait normal.     Deep Tendon Reflexes: Reflexes normal.  Psychiatric:        Mood and Affect: Mood normal.        Behavior: Behavior normal.      No results found for any visits on 11/05/23.  Last CBC Lab Results  Component Value Date   WBC 9.7 10/08/2023   HGB 15.3 10/08/2023   HCT 44.7 10/08/2023   MCV 95.9 10/08/2023   MCH 32.8 10/08/2023   RDW 11.8 10/08/2023   PLT 271 10/08/2023   Last metabolic panel Lab Results  Component Value Date   GLUCOSE 104 (H) 10/08/2023    NA 139 10/08/2023   K 4.9 10/08/2023   CL 104 10/08/2023   CO2 27 10/08/2023   BUN 11 10/08/2023   CREATININE 0.87 10/08/2023   EGFR 95 10/08/2023   CALCIUM 9.4 10/08/2023   PROT 7.0 10/08/2023   ALBUMIN 4.2 01/22/2017   LABGLOB 2.9 06/25/2016   AGRATIO 1.5 06/25/2016   BILITOT 0.6 10/08/2023   ALKPHOS 53 01/22/2017   AST 18 10/08/2023   ALT 25 10/08/2023   ANIONGAP 8 08/24/2022   Last lipids Lab Results  Component Value Date   CHOL 102 10/08/2023   HDL 29 (L) 10/08/2023   LDLCALC 58 10/08/2023   TRIG 73 10/08/2023   CHOLHDL 3.5 10/08/2023   Last hemoglobin A1c Lab Results  Component Value Date   HGBA1C 6.2 (H) 10/08/2023   Last thyroid functions Lab Results  Component Value Date   TSH 2.83 10/08/2023   Last vitamin D Lab Results  Component Value Date   VD25OH 18 (L) 10/08/2023   Last vitamin B12 and Folate Lab Results  Component Value Date   VITAMINB12 334 10/08/2023   FOLATE 14.5 10/08/2023      The ASCVD Risk score (Arnett DK, et al., 2019) failed to calculate for the following reasons:   The patient has a prior MI or stroke diagnosis    Assessment & Plan:   1. Essential hypertension: Blood pressure well controlled, continue Losartan 25 mg. Blood work due.   - CBC w/Diff/Platelet - COMPLETE METABOLIC PANEL WITH GFR - losartan (COZAAR) 25 MG tablet; Take 1 tablet (25 mg total) by mouth daily.  Dispense: 90 tablet; Refill: 1  2. Mixed hyperlipidemia: Recheck fasting lipid panel. Refill statin.   - Lipid Profile - atorvastatin (LIPITOR) 80 MG tablet; Take 1 tablet (80 mg total) by mouth daily.  Dispense: 90 tablet; Refill: 1  3. Prediabetes: Recheck A1c.   - HgB A1c  4. Tremor: Neuro exam here normal, will check thyroid and vitamins today. 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   - TSH - Vitamin D (25 hydroxy) - B12 and Folate Panel - Vitamin B12 - propranolol ER  (INDERAL LA) 60 MG 24 hr capsule; Take 1 capsule (60 mg total) by mouth daily.  Dispense: 30 capsule; Refill: 0  5. Vitamin D deficiency: Check vitamin D.   - Vitamin D (25 hydroxy)  6. Screening for prostate cancer: Screening due.   - PSA   No follow-ups on file.    Margarita Mail, DO

## 2023-11-05 ENCOUNTER — Encounter: Payer: Self-pay | Admitting: Internal Medicine

## 2023-11-05 ENCOUNTER — Ambulatory Visit (INDEPENDENT_AMBULATORY_CARE_PROVIDER_SITE_OTHER): Payer: Medicare Other | Admitting: Internal Medicine

## 2023-11-05 VITALS — BP 140/80 | HR 76 | Temp 98.1°F | Resp 18 | Ht 69.0 in | Wt 183.5 lb

## 2023-11-05 DIAGNOSIS — R251 Tremor, unspecified: Secondary | ICD-10-CM | POA: Diagnosis not present

## 2023-11-05 MED ORDER — PROPRANOLOL HCL ER 80 MG PO CP24: 80.0000 mg | ORAL_CAPSULE | Freq: Every day | ORAL | 0 refills | Status: DC

## 2023-12-03 DIAGNOSIS — H52223 Regular astigmatism, bilateral: Secondary | ICD-10-CM | POA: Diagnosis not present

## 2023-12-03 DIAGNOSIS — H5213 Myopia, bilateral: Secondary | ICD-10-CM | POA: Diagnosis not present

## 2023-12-03 DIAGNOSIS — H524 Presbyopia: Secondary | ICD-10-CM | POA: Diagnosis not present

## 2024-01-27 ENCOUNTER — Other Ambulatory Visit: Payer: Self-pay | Admitting: Internal Medicine

## 2024-01-27 DIAGNOSIS — I10 Essential (primary) hypertension: Secondary | ICD-10-CM

## 2024-01-28 NOTE — Telephone Encounter (Signed)
 Requested Prescriptions  Refused Prescriptions Disp Refills   losartan  (COZAAR ) 25 MG tablet [Pharmacy Med Name: LOSARTAN  POTASSIUM 25 MG TAB] 90 tablet 1    Sig: TAKE 1 TABLET (25 MG TOTAL) BY MOUTH DAILY.     Cardiovascular:  Angiotensin Receptor Blockers Failed - 01/28/2024  4:27 PM      Failed - Last BP in normal range    BP Readings from Last 1 Encounters:  11/05/23 (!) 140/80         Passed - Cr in normal range and within 180 days    Creat  Date Value Ref Range Status  10/08/2023 0.87 0.70 - 1.35 mg/dL Final         Passed - K in normal range and within 180 days    Potassium  Date Value Ref Range Status  10/08/2023 4.9 3.5 - 5.3 mmol/L Final         Passed - Patient is not pregnant      Passed - Valid encounter within last 6 months    Recent Outpatient Visits           2 months ago Tremor   [facility] Bernardo Fend, DO   3 months ago Essential hypertension   [facility] Bernardo Fend, DO   6 months ago Essential hypertension   [facility] Bernardo Fend, DO   1 year ago Essential hypertension   [facility] Bernardo Fend, DO   1 year ago Hospital discharge follow-up   [facility] Bernardo Fend, DO       Future Appointments             In 1 week Bernardo Fend, DO [facility], [facility]

## 2024-02-01 ENCOUNTER — Other Ambulatory Visit: Payer: Self-pay | Admitting: Internal Medicine

## 2024-02-01 DIAGNOSIS — R251 Tremor, unspecified: Secondary | ICD-10-CM

## 2024-02-02 NOTE — Telephone Encounter (Signed)
Requested Prescriptions  Pending Prescriptions Disp Refills   propranolol ER (INDERAL LA) 80 MG 24 hr capsule [Pharmacy Med Name: PROPRANOLOL ER 80 MG CAPSULE] 90 capsule 0    Sig: TAKE 1 CAPSULE BY MOUTH EVERY DAY     Cardiovascular:  Beta Blockers Failed - 02/02/2024  8:38 AM      Failed - Last BP in normal range    BP Readings from Last 1 Encounters:  11/05/23 (!) 140/80         Passed - Last Heart Rate in normal range    Pulse Readings from Last 1 Encounters:  11/05/23 76         Passed - Valid encounter within last 6 months    Recent Outpatient Visits           2 months ago Tremor   [facility] Margarita Mail, DO   3 months ago Essential hypertension   [facility] Margarita Mail, DO   6 months ago Essential hypertension   [facility] Margarita Mail, DO   1 year ago Essential hypertension   [facility] Margarita Mail, DO   1 year ago Hospital discharge follow-up   [facility] Margarita Mail, DO       Future Appointments             In 3 days Margarita Mail, DO [facility], [facility]

## 2024-02-05 ENCOUNTER — Other Ambulatory Visit: Payer: Self-pay

## 2024-02-05 ENCOUNTER — Ambulatory Visit (INDEPENDENT_AMBULATORY_CARE_PROVIDER_SITE_OTHER): Payer: Medicare Other | Admitting: Internal Medicine

## 2024-02-05 ENCOUNTER — Encounter: Payer: Self-pay | Admitting: Internal Medicine

## 2024-02-05 VITALS — BP 134/72 | HR 94 | Temp 98.3°F | Resp 16 | Ht 69.0 in | Wt 182.3 lb

## 2024-02-05 DIAGNOSIS — R7303 Prediabetes: Secondary | ICD-10-CM

## 2024-02-05 DIAGNOSIS — I10 Essential (primary) hypertension: Secondary | ICD-10-CM | POA: Diagnosis not present

## 2024-02-05 DIAGNOSIS — E782 Mixed hyperlipidemia: Secondary | ICD-10-CM

## 2024-02-05 DIAGNOSIS — R251 Tremor, unspecified: Secondary | ICD-10-CM | POA: Diagnosis not present

## 2024-02-05 LAB — POCT GLYCOSYLATED HEMOGLOBIN (HGB A1C): Hemoglobin A1C: 6.3 % — AB (ref 4.0–5.6)

## 2024-02-05 MED ORDER — PROPRANOLOL HCL 80 MG PO TABS: 80.0000 mg | ORAL_TABLET | Freq: Two times a day (BID) | ORAL | 1 refills | Status: DC

## 2024-02-05 NOTE — Assessment & Plan Note (Signed)
Blood pressure stable here today, no changes made to medications and appropriate refills sent to pharmacy.

## 2024-02-05 NOTE — Assessment & Plan Note (Signed)
Stable, doing well on statin.

## 2024-02-05 NOTE — Assessment & Plan Note (Signed)
A1c 6.3% today, continue to monitor.

## 2024-02-05 NOTE — Progress Notes (Signed)
Established Patient Office Visit  Subjective   Patient ID: Travis Watson, male    DOB: 1956/10/13  Age: 68 y.o. MRN: [id number]  Chief Complaint  Patient presents with   Medical Management of Chronic Issues    3 month follow up    HPI  Patient is here today for follow up on chronic medical conditions.   Tremor: -In bilateral hands that has been ongoing and progressively worsening over the last 6 months to a year.  -The tremor is mainly in his hands and occurs with action, like when he is writing or shaving.  -Noticed he is dropping things more frequently.  Has not noticed any other neurologic symptoms or changes. -His mother had a similar tremor that developed in her 53's. -Had now been on Propanolol for a few months, increased to 80 mg at LOV but has not noticed any difference   Hypertension: -Medications: Losartan 25 mg  -Patient is compliant with above medications and reports no side effects. -Checking BP at home (average): not checking, has been controlled for years -Denies any SOB, CP, vision changes, LE edema or symptoms of hypotension  HLD/history of CVA: -Medications: Lipitor 80 mg, aspirin 81 mg -Patient is compliant with above medications and reports no side effects.  -Last lipid panel: Lipid Panel     Component Value Date/Time   CHOL 102 10/08/2023 0832   CHOL 153 06/25/2016 0847   TRIG 73 10/08/2023 0832   HDL 29 (L) 10/08/2023 0832   HDL 31 (L) 06/25/2016 0847   CHOLHDL 3.5 10/08/2023 0832   VLDL 16 08/25/2022 0504   LDLCALC 58 10/08/2023 0832   LABVLDL 23 06/25/2016 0847    Pre-Diabetes: -Last A1c 6.2% 10/24 -Trying to watch diet, cut out breads and most carbs since CVA in September 2023, eating lean proteins as well -Extensive family history of diabetes   Health Maintenance: -Blood work UTD -Due for Tdap, Prevnar 20 which he politely declines today -Colon cancer screening: cologuard positive; colonoscopy 9/24 with recommendations to  repeat in 3-5 years.  -Lung cancer screening performed 10/24, still waiting for results   Patient Active Problem List   Diagnosis Date Noted   Positive colorectal cancer screening using Cologuard test 08/28/2023   Adenomatous polyp of cecum 08/28/2023   Adenomatous polyp of transverse colon 08/28/2023   CVA (cerebral vascular accident) (HCC) 08/25/2022   Chronic diastolic CHF (congestive heart failure) (HCC) 08/25/2022   Acute CVA (cerebrovascular accident) (HCC) 08/24/2022   Overweight (BMI 25.0-29.9) 07/11/2019   Hx of colonic polyps 12/30/2018   Renal cyst 10/19/2018   Paraseptal emphysema (HCC) 08/13/2018   Low HDL (under 40) 06/11/2018   Prediabetes 05/29/2018   Degenerative disc disease, cervical 11/25/2016   Neural foraminal stenosis of cervical spine 11/25/2016   Tobacco abuse counseling 12/25/2015   Essential hypertension 08/23/2015   Hyperlipidemia 08/23/2015   Past Medical History:  Diagnosis Date   Hyperlipidemia    Hypertension    Paraseptal emphysema (HCC) 08/13/2018   Past Surgical History:  Procedure Laterality Date   COLONOSCOPY WITH PROPOFOL N/A 08/28/2023   Procedure: COLONOSCOPY WITH PROPOFOL;  Surgeon: Toney Reil, MD;  [facility] ENDOSCOPY;  Service: Gastroenterology;  Laterality: N/A;   POLYPECTOMY  08/28/2023   Procedure: POLYPECTOMY;  Surgeon: Toney Reil, MD;  Location: [facility] ENDOSCOPY;  Service: Gastroenterology;;   Social History   Tobacco Use   Smoking status: Every Day    Current packs/day: 1.00    Average packs/day: 1 pack/day  for 49.0 years (49.0 ttl pk-yrs)    Types: Cigarettes   Smokeless tobacco: Former    Types: Snuff   Tobacco comments:    quit smokeless tabacco about 40 years ago  Vaping Use   Vaping status: Never Used  Substance Use Topics   Alcohol use: Yes    Alcohol/week: 0.0 standard drinks of alcohol    Comment: occasional   Drug use: No   Social History   Socioeconomic History   Marital status: Single     Spouse name: Not on file   Number of children: Not on file   Years of education: Not on file   Highest education level: Associate degree: occupational, Scientist, product/process development, or vocational program  Occupational History   Not on file  Tobacco Use   Smoking status: Every Day    Current packs/day: 1.00    Average packs/day: 1 pack/day for 49.0 years (49.0 ttl pk-yrs)    Types: Cigarettes   Smokeless tobacco: Former    Types: Snuff   Tobacco comments:    quit smokeless tabacco about 40 years ago  Vaping Use   Vaping status: Never Used  Substance and Sexual Activity   Alcohol use: Yes    Alcohol/week: 0.0 standard drinks of alcohol    Comment: occasional   Drug use: No   Sexual activity: Not Currently  Other Topics Concern   Not on file  Social History Narrative   Not on file   Social Drivers of Health   Financial Resource Strain: Low Risk  (02/01/2024)   Overall Financial Resource Strain (CARDIA)    Difficulty of Paying Living Expenses: Not hard at all  Food Insecurity: No Food Insecurity (02/01/2024)   Hunger Vital Sign    Worried About Running Out of Food in the Last Year: Never true    Ran Out of Food in the Last Year: Never true  Transportation Needs: No Transportation Needs (02/01/2024)   PRAPARE - Administrator, Civil Service (Medical): No    Lack of Transportation (Non-Medical): No  Physical Activity: Insufficiently Active (02/01/2024)   Exercise Vital Sign    Days of Exercise per Week: 3 days    Minutes of Exercise per Session: 30 min  Stress: No Stress Concern Present (02/01/2024)   [employer] of Occupational Health - Occupational Stress Questionnaire    Feeling of Stress : Not at all  Social Connections: Socially Isolated (02/01/2024)   Social Connection and Isolation Panel [NHANES]    Frequency of Communication with Friends and Family: Twice a week    Frequency of Social Gatherings with Friends and Family: Once a week    Attends Religious Services:  Never    Database administrator or Organizations: No    Attends Banker Meetings: Never    Marital Status: Divorced  Catering manager Violence: Not At Risk (06/19/2023)   Humiliation, Afraid, Rape, and Kick questionnaire    Fear of Current or Ex-Partner: No    Emotionally Abused: No    Physically Abused: No    Sexually Abused: No   Family Status  Relation Name Status   Mother  Deceased       diabetes and emphysema   Father  Deceased       old age   PGF  Deceased       heart attack    Brother  Alive   Son  Alive   MGM  Deceased   MGF  Deceased   PGM  Deceased  No partnership data on file   Family History  Problem Relation Age of Onset   Diabetes Mother    Emphysema Mother    Hyperlipidemia Father    Cancer Father        prostate   Heart disease Maternal Grandmother    Hypertension Maternal Grandmother    Heart disease Maternal Grandfather    Hypertension Maternal Grandfather    Migraines Paternal Grandmother    No Known Allergies    Review of Systems  Constitutional:  Negative for chills and fever.  Neurological:  Positive for tremors.      Objective:     BP 134/72 (Cuff Size: Large)   Pulse 94   Temp 98.3 F (36.8 C) (Oral)   Resp 16   Ht 5\' 9"  (1.753 m)   Wt 182 lb 4.8 oz (82.7 kg)   SpO2 96%   BMI 26.92 kg/m  BP Readings from Last 3 Encounters:  02/05/24 134/72  11/05/23 (!) 140/80  10/08/23 130/78   Wt Readings from Last 3 Encounters:  02/05/24 182 lb 4.8 oz (82.7 kg)  11/05/23 183 lb 8 oz (83.2 kg)  10/08/23 180 lb 8 oz (81.9 kg)      Physical Exam Constitutional:      Appearance: Normal appearance.  HENT:     Head: Normocephalic and atraumatic.  Eyes:     Conjunctiva/sclera: Conjunctivae normal.  Cardiovascular:     Rate and Rhythm: Normal rate and regular rhythm.  Pulmonary:     Effort: Pulmonary effort is normal.     Breath sounds: Normal breath sounds.  Skin:    General: Skin is warm and dry.  Neurological:      General: No focal deficit present.     Mental Status: He is alert. Mental status is at baseline.     Comments: Tremor in left hand only with fine motor movements   Psychiatric:        Mood and Affect: Mood normal.        Behavior: Behavior normal.      Results for orders placed or performed in visit on 02/05/24  POCT HgB A1C  Result Value Ref Range   Hemoglobin A1C 6.3 (A) 4.0 - 5.6 %   HbA1c POC (<> result, manual entry)     HbA1c, POC (prediabetic range)     HbA1c, POC (controlled diabetic range)      Last CBC Lab Results  Component Value Date   WBC 9.7 10/08/2023   HGB 15.3 10/08/2023   HCT 44.7 10/08/2023   MCV 95.9 10/08/2023   MCH 32.8 10/08/2023   RDW 11.8 10/08/2023   PLT 271 10/08/2023   Last metabolic panel Lab Results  Component Value Date   GLUCOSE 104 (H) 10/08/2023   NA 139 10/08/2023   K 4.9 10/08/2023   CL 104 10/08/2023   CO2 27 10/08/2023   BUN 11 10/08/2023   CREATININE 0.87 10/08/2023   EGFR 95 10/08/2023   CALCIUM 9.4 10/08/2023   PROT 7.0 10/08/2023   ALBUMIN 4.2 01/22/2017   LABGLOB 2.9 06/25/2016   AGRATIO 1.5 06/25/2016   BILITOT 0.6 10/08/2023   ALKPHOS 53 01/22/2017   AST 18 10/08/2023   ALT 25 10/08/2023   ANIONGAP 8 08/24/2022   Last lipids Lab Results  Component Value Date   CHOL 102 10/08/2023   HDL 29 (L) 10/08/2023   LDLCALC 58 10/08/2023   TRIG 73 10/08/2023   CHOLHDL 3.5 10/08/2023   Last hemoglobin  A1c Lab Results  Component Value Date   HGBA1C 6.3 (A) 02/05/2024   Last thyroid functions Lab Results  Component Value Date   TSH 2.83 10/08/2023   Last vitamin D Lab Results  Component Value Date   VD25OH 18 (L) 10/08/2023   Last vitamin B12 and Folate Lab Results  Component Value Date   VITAMINB12 334 10/08/2023   FOLATE 14.5 10/08/2023      The ASCVD Risk score (Arnett DK, et al., 2019) failed to calculate for the following reasons:   Risk score cannot be calculated because patient has a medical  history suggesting prior/existing ASCVD    Assessment & Plan:  Tremor -     Propranolol HCl; Take 1 tablet (80 mg total) by mouth 2 (two) times daily.  Dispense: 180 tablet; Refill: 1  Prediabetes Assessment & Plan: A1c 6.3% today, continue to monitor.   Orders: -     POCT glycosylated hemoglobin (Hb A1C)  Essential hypertension Assessment & Plan: Blood pressure stable here today, no changes made to medications and appropriate refills sent to pharmacy.     Mixed hyperlipidemia Assessment & Plan: Stable, doing well on statin.    Tremor unchanged, recommend he be seen by Neurology but he declines today. Will increase Propanolol 80 mg BID and continue to monitor.   Return in about 6 months (around 08/04/2024).    Margarita Mail, DO

## 2024-04-05 ENCOUNTER — Ambulatory Visit

## 2024-04-05 DIAGNOSIS — L821 Other seborrheic keratosis: Secondary | ICD-10-CM | POA: Diagnosis not present

## 2024-04-05 DIAGNOSIS — D2372 Other benign neoplasm of skin of left lower limb, including hip: Secondary | ICD-10-CM | POA: Diagnosis not present

## 2024-04-05 DIAGNOSIS — L814 Other melanin hyperpigmentation: Secondary | ICD-10-CM | POA: Diagnosis not present

## 2024-04-05 DIAGNOSIS — L404 Guttate psoriasis: Secondary | ICD-10-CM | POA: Diagnosis not present

## 2024-04-06 ENCOUNTER — Ambulatory Visit
Admission: RE | Admit: 2024-04-06 | Discharge: 2024-04-06 | Disposition: A | Discharge status: Home / Self Care | Source: Ambulatory Visit | Attending: Acute Care | Admitting: Acute Care

## 2024-04-06 DIAGNOSIS — R911 Solitary pulmonary nodule: Secondary | ICD-10-CM | POA: Diagnosis not present

## 2024-04-06 DIAGNOSIS — I700 Atherosclerosis of aorta: Secondary | ICD-10-CM | POA: Diagnosis not present

## 2024-04-06 DIAGNOSIS — Z87891 Personal history of nicotine dependence: Secondary | ICD-10-CM | POA: Insufficient documentation

## 2024-04-06 DIAGNOSIS — J432 Centrilobular emphysema: Secondary | ICD-10-CM | POA: Diagnosis not present

## 2024-04-20 ENCOUNTER — Other Ambulatory Visit: Payer: Self-pay | Admitting: Internal Medicine

## 2024-04-20 DIAGNOSIS — E782 Mixed hyperlipidemia: Secondary | ICD-10-CM

## 2024-04-22 NOTE — Telephone Encounter (Signed)
 Requested by interface surescripts. Last labs 10/08/23 future visit in 3 months .  Requested Prescriptions  Pending Prescriptions Disp Refills   atorvastatin  (LIPITOR) 80 MG tablet [Pharmacy Med Name: ATORVASTATIN  80 MG TABLET] 90 tablet 1    Sig: TAKE 1 TABLET BY MOUTH EVERY DAY     Cardiovascular:  Antilipid - Statins Failed - 04/22/2024  4:26 PM      Failed - Lipid Panel in normal range within the last 12 months    Cholesterol, Total  Date Value Ref Range Status  06/25/2016 153 100 - 199 mg/dL Final   Cholesterol  Date Value Ref Range Status  10/08/2023 102 <200 mg/dL Final   LDL Cholesterol (Calc)  Date Value Ref Range Status  10/08/2023 58 mg/dL (calc) Final    Comment:    Reference range: <100 . Desirable range <100 mg/dL for primary prevention;   <70 mg/dL for patients with CHD or diabetic patients  with > or = 2 CHD risk factors. Aaron Aas LDL-C is now calculated using the Martin-Hopkins  calculation, which is a validated novel method providing  better accuracy than the Friedewald equation in the  estimation of LDL-C.  Melinda Sprawls et al. Erroll Heard. 2536;644(03): 2061-2068  (http://education.[facility].com/faq/FAQ164)    HDL  Date Value Ref Range Status  10/08/2023 29 (L) > OR = 40 mg/dL Final  47/42/5956 31 (L) >39 mg/dL Final   Triglycerides  Date Value Ref Range Status  10/08/2023 73 <150 mg/dL Final         Passed - Patient is not pregnant      Passed - Valid encounter within last 12 months    Recent Outpatient Visits           2 months ago Tremor   [facility] Rockney Cid, DO       Future Appointments             In 3 months Rockney Cid, DO [facility], [facility]

## 2024-04-29 ENCOUNTER — Other Ambulatory Visit: Payer: Self-pay | Admitting: Internal Medicine

## 2024-04-29 DIAGNOSIS — I10 Essential (primary) hypertension: Secondary | ICD-10-CM

## 2024-05-02 NOTE — Telephone Encounter (Signed)
 Requested Prescriptions  Pending Prescriptions Disp Refills   losartan  (COZAAR ) 25 MG tablet [Pharmacy Med Name: LOSARTAN  POTASSIUM 25 MG TAB] 90 tablet 1    Sig: TAKE 1 TABLET (25 MG TOTAL) BY MOUTH DAILY.     Cardiovascular:  Angiotensin Receptor Blockers Failed - 05/02/2024  2:56 PM      Failed - Cr in normal range and within 180 days    Creat  Date Value Ref Range Status  10/08/2023 0.87 0.70 - 1.35 mg/dL Final         Failed - K in normal range and within 180 days    Potassium  Date Value Ref Range Status  10/08/2023 4.9 3.5 - 5.3 mmol/L Final         Passed - Patient is not pregnant      Passed - Last BP in normal range    BP Readings from Last 1 Encounters:  02/05/24 134/72         Passed - Valid encounter within last 6 months    Recent Outpatient Visits           2 months ago Tremor   [facility] Rockney Cid, DO       Future Appointments             In 3 months Rockney Cid, DO [facility], [facility]

## 2024-05-06 ENCOUNTER — Other Ambulatory Visit: Payer: Self-pay

## 2024-05-06 DIAGNOSIS — F17210 Nicotine dependence, cigarettes, uncomplicated: Secondary | ICD-10-CM

## 2024-05-06 DIAGNOSIS — Z122 Encounter for screening for malignant neoplasm of respiratory organs: Secondary | ICD-10-CM

## 2024-05-06 DIAGNOSIS — Z87891 Personal history of nicotine dependence: Secondary | ICD-10-CM

## 2024-06-23 ENCOUNTER — Ambulatory Visit: Payer: Medicare Other

## 2024-06-23 DIAGNOSIS — Z0000 Encounter for general adult medical examination without abnormal findings: Secondary | ICD-10-CM | POA: Diagnosis not present

## 2024-06-23 NOTE — Progress Notes (Signed)
 Subjective:   Travis Watson is a 68 y.o. who presents for a Medicare Wellness preventive visit.  As a reminder, Annual Wellness Visits don't include a physical exam, and some assessments may be limited, especially if this visit is performed virtually. We may recommend an in-person follow-up visit with your provider if needed.  Visit Complete: Virtual I connected with  Travis Watson on 06/23/24 by a audio enabled telemedicine application and verified that I am speaking with the correct person using two identifiers.  Patient Location: Home  Provider Location: Home Office  I discussed the limitations of evaluation and management by telemedicine. The patient expressed understanding and agreed to proceed.  Vital Signs: Because this visit was a virtual/telehealth visit, some criteria may be missing or patient reported. Any vitals not documented were not able to be obtained and vitals that have been documented are patient reported.  VideoDeclined- This patient declined Librarian, academic. Therefore the visit was completed with audio only.  Persons Participating in Visit: Patient.  AWV Questionnaire: No: Patient Medicare AWV questionnaire was not completed prior to this visit.  Cardiac Risk Factors include: advanced age (>11men, >29 women);hypertension;male gender;dyslipidemia;smoking/ tobacco exposure     Objective:    There were no vitals filed for this visit. There is no height or weight on file to calculate BMI.     06/23/2024    3:17 PM 08/28/2023    6:37 AM 06/19/2023    3:16 PM 08/29/2022   10:06 AM 08/24/2022    5:26 PM 08/24/2022   11:01 AM 07/22/2017    8:19 AM  Advanced Directives  Does Patient Have a Medical Advance Directive? No No No No No No No   Would patient like information on creating a medical advance directive? No - Patient declined    Yes (Inpatient - patient defers creating a medical advance directive at this time -  Information given)       Data saved with a previous flowsheet row definition    Current Medications (verified) Outpatient Encounter Medications as of 06/23/2024  Medication Sig   aspirin  EC 81 MG tablet Take 1 tablet (81 mg total) by mouth daily. Swallow whole.   atorvastatin  (LIPITOR) 80 MG tablet TAKE 1 TABLET BY MOUTH EVERY DAY   fluocinonide cream (LIDEX) 0.05 % APPLY ON THE SKIN TWICE A DAY AS NEEDED   losartan  (COZAAR ) 25 MG tablet TAKE 1 TABLET (25 MG TOTAL) BY MOUTH DAILY.   propranolol  (INDERAL ) 80 MG tablet Take 1 tablet (80 mg total) by mouth 2 (two) times daily.   Vitamin D , Ergocalciferol , (DRISDOL ) 1.25 MG (50000 UNIT) CAPS capsule Take 1 capsule (50,000 Units total) by mouth every 7 (seven) days.   No facility-administered encounter medications on file as of 06/23/2024.    Allergies (verified) Patient has no known allergies.   History: Past Medical History:  Diagnosis Date   Hyperlipidemia    Hypertension    Paraseptal emphysema (HCC) 08/13/2018   Past Surgical History:  Procedure Laterality Date   COLONOSCOPY WITH PROPOFOL  N/A 08/28/2023   Procedure: COLONOSCOPY WITH PROPOFOL ;  Surgeon: Unk Corinn Skiff, MD;  Location: [facility] ENDOSCOPY;  Service: Gastroenterology;  Laterality: N/A;   POLYPECTOMY  08/28/2023   Procedure: POLYPECTOMY;  Surgeon: Unk Corinn Skiff, MD;  Location: [facility] ENDOSCOPY;  Service: Gastroenterology;;   Family History  Problem Relation Age of Onset   Diabetes Mother    Emphysema Mother    Hyperlipidemia Father    Cancer  Father        prostate   Heart disease Maternal Grandmother    Hypertension Maternal Grandmother    Heart disease Maternal Grandfather    Hypertension Maternal Grandfather    Migraines Paternal Grandmother    Social History   Socioeconomic History   Marital status: Single    Spouse name: Not on file   Number of children: Not on file   Years of education: Not on file   Highest education level: Associate degree:  occupational, Scientist, product/process development, or vocational program  Occupational History   Not on file  Tobacco Use   Smoking status: Every Day    Current packs/day: 1.00    Average packs/day: 1 pack/day for 49.0 years (49.0 ttl pk-yrs)    Types: Cigarettes   Smokeless tobacco: Former    Types: Snuff   Tobacco comments:    quit smokeless tabacco about 40 years ago  Vaping Use   Vaping status: Never Used  Substance and Sexual Activity   Alcohol use: Yes    Alcohol/week: 0.0 standard drinks of alcohol    Comment: occasional   Drug use: No   Sexual activity: Not Currently  Other Topics Concern   Not on file  Social History Narrative   Not on file   Social Drivers of Health   Financial Resource Strain: Low Risk  (06/23/2024)   Overall Financial Resource Strain (CARDIA)    Difficulty of Paying Living Expenses: Not hard at all  Food Insecurity: No Food Insecurity (06/23/2024)   Hunger Vital Sign    Worried About Running Out of Food in the Last Year: Never true    Ran Out of Food in the Last Year: Never true  Transportation Needs: No Transportation Needs (06/23/2024)   PRAPARE - Administrator, Civil Service (Medical): No    Lack of Transportation (Non-Medical): No  Physical Activity: Insufficiently Active (06/23/2024)   Exercise Vital Sign    Days of Exercise per Week: 3 days    Minutes of Exercise per Session: 30 min  Stress: No Stress Concern Present (06/23/2024)   [employer] of Occupational Health - Occupational Stress Questionnaire    Feeling of Stress: Not at all  Social Connections: Socially Isolated (06/23/2024)   Social Connection and Isolation Panel    Frequency of Communication with Friends and Family: Twice a week    Frequency of Social Gatherings with Friends and Family: Once a week    Attends Religious Services: Never    Database administrator or Organizations: No    Attends Engineer, structural: Never    Marital Status: Divorced    Tobacco Counseling Ready  to quit: Not Answered Counseling given: Not Answered Tobacco comments: quit smokeless tabacco about 40 years ago    Clinical Intake:  Pre-visit preparation completed: Yes  Pain : No/denies pain     BMI - recorded: 26.9 Nutritional Status: BMI 25 -29 Overweight Nutritional Risks: None Diabetes: No  Lab Results  Component Value Date   HGBA1C 6.3 (A) 02/05/2024   HGBA1C 6.2 (H) 10/08/2023   HGBA1C 6.3 (A) 07/08/2023     How often do you need to have someone help you when you read instructions, pamphlets, or other written materials from your doctor or pharmacy?: 1 - Never  Interpreter Needed?: No  Information entered by :: JHONNIE DAS, LPN   Activities of Daily Living    06/23/2024    3:18 PM 06/20/2024    6:31 AM  In your  present state of health, do you have any difficulty performing the following activities:  Hearing? 0 0  Vision? 0 0  Difficulty concentrating or making decisions? 0 0  Walking or climbing stairs? 0 0  Dressing or bathing? 0 0  Doing errands, shopping? 0 0  Preparing Food and eating ? N N  Using the Toilet? N N  In the past six months, have you accidently leaked urine? N N  Do you have problems with loss of bowel control? N N  Managing your Medications? N N  Managing your Finances? N N  Housekeeping or managing your Housekeeping? N N    Patient Care Team: Bernardo Fend, DO as PCP - General (Internal Medicine) Center, [facility]  I have updated your Care Teams any recent Medical Services you may have received from other providers in the past year.     Assessment:   This is a routine wellness examination for Travis Watson.  Hearing/Vision screen Hearing Screening - Comments:: NO AIDS Vision Screening - Comments:: WEARS GLASSES ALL DAY- [facility]   Goals Addressed             This Visit's Progress    DIET - EAT MORE FRUITS AND VEGETABLES         Depression Screen     06/23/2024    3:16 PM 02/05/2024    2:55 PM  11/05/2023    2:41 PM 10/08/2023    8:09 AM 07/08/2023    9:29 AM 06/19/2023    3:15 PM 11/24/2022    8:19 AM  PHQ 2/9 Scores  PHQ - 2 Score 0 0 0 0 0 0 0  PHQ- 9 Score 0  0 0 0  0    Fall Risk     06/23/2024    3:18 PM 06/20/2024    6:31 AM 02/05/2024    2:54 PM 11/05/2023    2:40 PM 10/08/2023    8:09 AM  Fall Risk   Falls in the past year? 0 0 0 0 0  Number falls in past yr: 0 0 0 0 0  Injury with Fall? 0 0 0 0 0  Risk for fall due to : No Fall Risks  No Fall Risks    Follow up Falls evaluation completed  Falls evaluation completed      MEDICARE RISK AT HOME:  Medicare Risk at Home Any stairs in or around the home?: Yes If so, are there any without handrails?: No Home free of loose throw rugs in walkways, pet beds, electrical cords, etc?: No Adequate lighting in your home to reduce risk of falls?: Yes Life alert?: No Use of a cane, walker or w/c?: No Grab bars in the bathroom?: No Shower chair or bench in shower?: No Elevated toilet seat or a handicapped toilet?: No  TIMED UP AND GO:  Was the test performed?  No  Cognitive Function: 6CIT completed        06/23/2024    3:19 PM 06/19/2023    3:18 PM  6CIT Screen  What Year? 0 points 0 points  What month? 0 points 0 points  What time? 0 points 0 points  Count back from 20 0 points 0 points  Months in reverse 0 points 0 points  Repeat phrase 0 points 0 points  Total Score 0 points 0 points    Immunizations  There is no immunization history on file for this patient.  Screening Tests Health Maintenance  Topic Date Due   Pneumococcal Vaccine:  50+ Years (1 of 2 - PCV) Never done   Zoster Vaccines- Shingrix (1 of 2) Never done   COVID-19 Vaccine (1 - 2024-25 season) Never done   INFLUENZA VACCINE  07/22/2024   Lung Cancer Screening  04/06/2025   Medicare Annual Wellness (AWV)  06/23/2025   Colonoscopy  08/27/2026   Hepatitis C Screening  Completed   Hepatitis B Vaccines  Aged Out   HPV VACCINES  Aged Out    Meningococcal B Vaccine  Aged Out   DTaP/Tdap/Td  Discontinued    Health Maintenance  Health Maintenance Due  Topic Date Due   Pneumococcal Vaccine: 50+ Years (1 of 2 - PCV) Never done   Zoster Vaccines- Shingrix (1 of 2) Never done   COVID-19 Vaccine (1 - 2024-25 season) Never done   Health Maintenance Items Addressed: UP TO DATE ON COLONOSCOPY; DECLINES ALL VACCINES; LUNG CA SCREEN UP TO DATE  Additional Screening:  Vision Screening: Recommended annual ophthalmology exams for early detection of glaucoma and other disorders of the eye. Would you like a referral to an eye doctor? No    Dental Screening: Recommended annual dental exams for proper oral hygiene  Community Resource Referral / Chronic Care Management: CRR required this visit?  No   CCM required this visit?  No   Plan:    I have personally reviewed and noted the following in the patient's chart:   Medical and social history Use of alcohol, tobacco or illicit drugs  Current medications and supplements including opioid prescriptions. Patient is not currently taking opioid prescriptions. Functional ability and status Nutritional status Physical activity Advanced directives List of other physicians Hospitalizations, surgeries, and ER visits in previous 12 months Vitals Screenings to include cognitive, depression, and falls Referrals and appointments  In addition, I have reviewed and discussed with patient certain preventive protocols, quality metrics, and best practice recommendations. A written personalized care plan for preventive services as well as general preventive health recommendations were provided to patient.   Jhonnie GORMAN Das, LPN   01/27/7973   After Visit Summary: (MyChart) Due to this being a telephonic visit, the after visit summary with patients personalized plan was offered to patient via MyChart   Notes: Nothing significant to report at this time.

## 2024-06-23 NOTE — Patient Instructions (Addendum)
 Travis Watson , Thank you for taking time out of your busy schedule to complete your Annual Wellness Visit with me. I enjoyed our conversation and look forward to speaking with you again next year. I, as well as your care team,  appreciate your ongoing commitment to your health goals. Please review the following plan we discussed and let me know if I can assist you in the future.    Follow up Visits: Next Medicare AWV with our clinical staff:   07/06/25 @ 1:20 PM BY PHONE Have you seen your provider in the last 6 months (3 months if uncontrolled diabetes)? Yes   Clinician Recommendations:  Aim for 30 minutes of exercise or brisk walking, 6-8 glasses of water, and 5 servings of fruits and vegetables each day. TAKE CARE!      This is a list of the screening recommended for you and due dates:  Health Maintenance  Topic Date Due   Pneumococcal Vaccine for age over 62 (1 of 2 - PCV) Never done   Zoster (Shingles) Vaccine (1 of 2) Never done   COVID-19 Vaccine (1 - 2024-25 season) Never done   Flu Shot  07/22/2024   Screening for Lung Cancer  04/06/2025   Medicare Annual Wellness Visit  06/23/2025   Colon Cancer Screening  08/27/2026   Hepatitis C Screening  Completed   Hepatitis B Vaccine  Aged Out   HPV Vaccine  Aged Out   Meningitis B Vaccine  Aged Out   DTaP/Tdap/Td vaccine  Discontinued    Advanced directives: (ACP Link)Information on Advanced Care Planning can be found at [place]  Secretary of [facility] Advance Health Care Directives Advance Health Care Directives. [email or web address]  Advance Care Planning is important because it:  [x]  Makes sure you receive the medical care that is consistent with your values, goals, and preferences  [x]  It provides guidance to your family and loved ones and reduces their decisional burden about whether or not they are making the right decisions based on your wishes.  Follow the link provided in your after visit summary or read over the paperwork we have  mailed to you to help you started getting your Advance Directives in place. If you need assistance in completing these, please reach out to us  so that we can help you!

## 2024-07-28 ENCOUNTER — Other Ambulatory Visit: Payer: Self-pay | Admitting: Internal Medicine

## 2024-07-28 DIAGNOSIS — I10 Essential (primary) hypertension: Secondary | ICD-10-CM

## 2024-07-29 NOTE — Telephone Encounter (Signed)
 Requested Prescriptions  Pending Prescriptions Disp Refills   losartan  (COZAAR ) 25 MG tablet [Pharmacy Med Name: LOSARTAN  POTASSIUM 25 MG TAB] 90 tablet 0    Sig: TAKE 1 TABLET (25 MG TOTAL) BY MOUTH DAILY.     Cardiovascular:  Angiotensin Receptor Blockers Failed - 07/29/2024 11:39 PM      Failed - Cr in normal range and within 180 days    Creat  Date Value Ref Range Status  10/08/2023 0.87 0.70 - 1.35 mg/dL Final         Failed - K in normal range and within 180 days    Potassium  Date Value Ref Range Status  10/08/2023 4.9 3.5 - 5.3 mmol/L Final         Passed - Patient is not pregnant      Passed - Last BP in normal range    BP Readings from Last 1 Encounters:  02/05/24 134/72         Passed - Valid encounter within last 6 months    Recent Outpatient Visits           5 months ago Tremor   [facility] Bernardo Fend, DO       Future Appointments             In 1 week Bernardo Fend, DO [facility]

## 2024-08-01 ENCOUNTER — Other Ambulatory Visit: Payer: Self-pay | Admitting: Internal Medicine

## 2024-08-01 DIAGNOSIS — R251 Tremor, unspecified: Secondary | ICD-10-CM

## 2024-08-04 NOTE — Telephone Encounter (Signed)
 Requested Prescriptions  Pending Prescriptions Disp Refills   propranolol  (INDERAL ) 80 MG tablet [Pharmacy Med Name: PROPRANOLOL  80 MG TABLET] 180 tablet 0    Sig: TAKE 1 TABLET BY MOUTH 2 TIMES DAILY.     Cardiovascular:  Beta Blockers Passed - 08/04/2024 12:18 PM      Passed - Last BP in normal range    BP Readings from Last 1 Encounters:  02/05/24 134/72         Passed - Last Heart Rate in normal range    Pulse Readings from Last 1 Encounters:  02/05/24 94         Passed - Valid encounter within last 6 months    Recent Outpatient Visits           6 months ago Tremor   [facility] Bernardo Fend, DO       Future Appointments             In 5 days Bernardo Fend, DO [facility]

## 2024-08-09 ENCOUNTER — Other Ambulatory Visit: Payer: Self-pay

## 2024-08-09 ENCOUNTER — Encounter: Payer: Self-pay | Admitting: Internal Medicine

## 2024-08-09 ENCOUNTER — Ambulatory Visit (INDEPENDENT_AMBULATORY_CARE_PROVIDER_SITE_OTHER): Payer: Medicare Other | Admitting: Internal Medicine

## 2024-08-09 VITALS — BP 126/74 | HR 67 | Temp 98.4°F | Resp 16 | Ht 69.0 in | Wt 183.5 lb

## 2024-08-09 DIAGNOSIS — R251 Tremor, unspecified: Secondary | ICD-10-CM

## 2024-08-09 NOTE — Progress Notes (Signed)
 Established Patient Office Visit  Subjective   Patient ID: Travis Watson, male    DOB: 08/01/1956  Age: 68 y.o. MRN: [id number]  Chief Complaint  Patient presents with   Medical Management of Chronic Issues    6 month recheck    HPI  Patient is here today for follow up on tremor.   Discussed the use of AI scribe software for clinical note transcription with the patient, who gave verbal consent to proceed.  History of Present Illness Travis Watson is a 68 year old male who presents with persistent tremors despite propranolol  treatment.  He experiences tremors in his left hand during activities requiring fine motor skills, such as writing or picking up objects. The tremor is absent at rest. Propranolol  80 mg twice daily has not significantly reduced the tremors. His heart rate has decreased to 67 beats per minute, which is lower than his usual rate of 80 beats per minute, but he reports no other significant side effects. There is no weakness or dropping of objects due to weakness, and the tremor does not affect his right hand. He does not experience nervousness or anxiety.   Tremor: -In bilateral hands that has been ongoing and progressively worsening over the last year  -The tremor is mainly in his left hand and occurs with action, like when he is writing or shaving.  -Noticed he is dropping things more frequently.  Has not noticed any other neurologic symptoms or changes. -His mother had a similar tremor that developed in her 60's. -Had now been on Propanolol 80 mg BID for a few weeks but has noticed no change in symptoms    Patient Active Problem List   Diagnosis Date Noted   Positive colorectal cancer screening using Cologuard test 08/28/2023   Adenomatous polyp of cecum 08/28/2023   Adenomatous polyp of transverse colon 08/28/2023   CVA (cerebral vascular accident) (HCC) 08/25/2022   Chronic diastolic CHF (congestive heart failure) (HCC) 08/25/2022    Acute CVA (cerebrovascular accident) (HCC) 08/24/2022   Overweight (BMI 25.0-29.9) 07/11/2019   Hx of colonic polyps 12/30/2018   Renal cyst 10/19/2018   Paraseptal emphysema (HCC) 08/13/2018   Low HDL (under 40) 06/11/2018   Prediabetes 05/29/2018   Degenerative disc disease, cervical 11/25/2016   Neural foraminal stenosis of cervical spine 11/25/2016   Tobacco abuse counseling 12/25/2015   Essential hypertension 08/23/2015   Hyperlipidemia 08/23/2015   Past Medical History:  Diagnosis Date   Hyperlipidemia    Hypertension    Paraseptal emphysema (HCC) 08/13/2018   Past Surgical History:  Procedure Laterality Date   COLONOSCOPY WITH PROPOFOL  N/A 08/28/2023   Procedure: COLONOSCOPY WITH PROPOFOL ;  Surgeon: Unk Corinn Skiff, MD;  Location: [facility] ENDOSCOPY;  Service: Gastroenterology;  Laterality: N/A;   POLYPECTOMY  08/28/2023   Procedure: POLYPECTOMY;  Surgeon: Unk Corinn Skiff, MD;  Location: [facility] ENDOSCOPY;  Service: Gastroenterology;;   Social History   Tobacco Use   Smoking status: Every Day    Current packs/day: 1.00    Average packs/day: 1 pack/day for 49.0 years (49.0 ttl pk-yrs)    Types: Cigarettes   Smokeless tobacco: Former    Types: Snuff   Tobacco comments:    quit smokeless tabacco about 40 years ago  Vaping Use   Vaping status: Never Used  Substance Use Topics   Alcohol use: Yes    Alcohol/week: 0.0 standard drinks of alcohol    Comment: occasional   Drug use: No   Social  History   Socioeconomic History   Marital status: Single    Spouse name: Not on file   Number of children: Not on file   Years of education: Not on file   Highest education level: Associate degree: occupational, Scientist, product/process development, or vocational program  Occupational History   Not on file  Tobacco Use   Smoking status: Every Day    Current packs/day: 1.00    Average packs/day: 1 pack/day for 49.0 years (49.0 ttl pk-yrs)    Types: Cigarettes   Smokeless tobacco: Former    Types:  Snuff   Tobacco comments:    quit smokeless tabacco about 40 years ago  Vaping Use   Vaping status: Never Used  Substance and Sexual Activity   Alcohol use: Yes    Alcohol/week: 0.0 standard drinks of alcohol    Comment: occasional   Drug use: No   Sexual activity: Not Currently  Other Topics Concern   Not on file  Social History Narrative   Not on file   Social Drivers of Health   Financial Resource Strain: Low Risk  (06/23/2024)   Overall Financial Resource Strain (CARDIA)    Difficulty of Paying Living Expenses: Not hard at all  Food Insecurity: No Food Insecurity (06/23/2024)   Hunger Vital Sign    Worried About Running Out of Food in the Last Year: Never true    Ran Out of Food in the Last Year: Never true  Transportation Needs: No Transportation Needs (06/23/2024)   PRAPARE - Administrator, Civil Service (Medical): No    Lack of Transportation (Non-Medical): No  Physical Activity: Insufficiently Active (06/23/2024)   Exercise Vital Sign    Days of Exercise per Week: 3 days    Minutes of Exercise per Session: 30 min  Stress: No Stress Concern Present (06/23/2024)   [employer] of Occupational Health - Occupational Stress Questionnaire    Feeling of Stress: Not at all  Social Connections: Socially Isolated (06/23/2024)   Social Connection and Isolation Panel    Frequency of Communication with Friends and Family: Twice a week    Frequency of Social Gatherings with Friends and Family: Once a week    Attends Religious Services: Never    Database administrator or Organizations: No    Attends Banker Meetings: Never    Marital Status: Divorced  Catering manager Violence: Not At Risk (06/23/2024)   Humiliation, Afraid, Rape, and Kick questionnaire    Fear of Current or Ex-Partner: No    Emotionally Abused: No    Physically Abused: No    Sexually Abused: No   Family Status  Relation Name Status   Mother  Deceased       diabetes and emphysema    Father  Deceased       old age   PGF  Deceased       heart attack    Brother  Alive   Son  Alive   MGM  Deceased   MGF  Deceased   PGM  Deceased  No partnership data on file   Family History  Problem Relation Age of Onset   Diabetes Mother    Emphysema Mother    Hyperlipidemia Father    Cancer Father        prostate   Heart disease Maternal Grandmother    Hypertension Maternal Grandmother    Heart disease Maternal Grandfather    Hypertension Maternal Grandfather    Migraines Paternal Grandmother  No Known Allergies    Review of Systems  Constitutional:  Negative for chills, fever and malaise/fatigue.  Neurological:  Positive for tremors. Negative for tingling, sensory change and weakness.      Objective:     BP 126/74   Pulse 67   Temp 98.4 F (36.9 C) (Oral)   Resp 16   Ht 5' 9 (1.753 m)   Wt 183 lb 8 oz (83.2 kg)   SpO2 99%   BMI 27.10 kg/m  BP Readings from Last 3 Encounters:  08/09/24 126/74  02/05/24 134/72  11/05/23 (!) 140/80   Wt Readings from Last 3 Encounters:  08/09/24 183 lb 8 oz (83.2 kg)  02/05/24 182 lb 4.8 oz (82.7 kg)  11/05/23 183 lb 8 oz (83.2 kg)      Physical Exam Constitutional:      Appearance: Normal appearance.  HENT:     Head: Normocephalic and atraumatic.  Eyes:     Conjunctiva/sclera: Conjunctivae normal.  Cardiovascular:     Rate and Rhythm: Normal rate and regular rhythm.  Pulmonary:     Effort: Pulmonary effort is normal.     Breath sounds: Normal breath sounds.  Skin:    General: Skin is warm and dry.  Neurological:     General: No focal deficit present.     Mental Status: He is alert. Mental status is at baseline.     Comments: Tremor in left hand only with fine motor movements   Psychiatric:        Mood and Affect: Mood normal.        Behavior: Behavior normal.      No results found for any visits on 08/09/24.   Last CBC Lab Results  Component Value Date   WBC 9.7 10/08/2023   HGB 15.3  10/08/2023   HCT 44.7 10/08/2023   MCV 95.9 10/08/2023   MCH 32.8 10/08/2023   RDW 11.8 10/08/2023   PLT 271 10/08/2023   Last metabolic panel Lab Results  Component Value Date   GLUCOSE 104 (H) 10/08/2023   NA 139 10/08/2023   K 4.9 10/08/2023   CL 104 10/08/2023   CO2 27 10/08/2023   BUN 11 10/08/2023   CREATININE 0.87 10/08/2023   EGFR 95 10/08/2023   CALCIUM  9.4 10/08/2023   PROT 7.0 10/08/2023   ALBUMIN 4.2 01/22/2017   LABGLOB 2.9 06/25/2016   AGRATIO 1.5 06/25/2016   BILITOT 0.6 10/08/2023   ALKPHOS 53 01/22/2017   AST 18 10/08/2023   ALT 25 10/08/2023   ANIONGAP 8 08/24/2022   Last lipids Lab Results  Component Value Date   CHOL 102 10/08/2023   HDL 29 (L) 10/08/2023   LDLCALC 58 10/08/2023   TRIG 73 10/08/2023   CHOLHDL 3.5 10/08/2023   Last hemoglobin A1c Lab Results  Component Value Date   HGBA1C 6.3 (A) 02/05/2024   Last thyroid  functions Lab Results  Component Value Date   TSH 2.83 10/08/2023   Last vitamin D  Lab Results  Component Value Date   VD25OH 18 (L) 10/08/2023   Last vitamin B12 and Folate Lab Results  Component Value Date   VITAMINB12 334 10/08/2023   FOLATE 14.5 10/08/2023      The ASCVD Risk score (Arnett DK, et al., 2019) failed to calculate for the following reasons:   Risk score cannot be calculated because patient has a medical history suggesting prior/existing ASCVD    Assessment & Plan:   Assessment & Plan Intention tremor, left hand Intention  tremor affects the left hand during tasks. Propranolol  increase ineffective; heart rate reduction suggests side effects. Differential includes essential tremor or other neurological conditions. Neurology referral needed for further evaluation. - Reduce propranolol  to 80 mg once daily or half a pill twice daily. - Refer to neurology for tremor evaluation. - Continue propranolol  until neurology appointment.  - Ambulatory referral to Neurology   Return in about 2 months  (around 10/09/2024).    Sharyle Fischer, DO

## 2024-10-01 ENCOUNTER — Other Ambulatory Visit: Payer: Self-pay | Admitting: Internal Medicine

## 2024-10-01 DIAGNOSIS — E782 Mixed hyperlipidemia: Secondary | ICD-10-CM

## 2024-10-04 NOTE — Telephone Encounter (Signed)
 Requested Prescriptions  Pending Prescriptions Disp Refills   atorvastatin  (LIPITOR) 80 MG tablet [Pharmacy Med Name: ATORVASTATIN  80 MG TABLET] 90 tablet 0    Sig: TAKE 1 TABLET BY MOUTH EVERY DAY     Cardiovascular:  Antilipid - Statins Failed - 10/04/2024 11:58 AM      Failed - Lipid Panel in normal range within the last 12 months    Cholesterol, Total  Date Value Ref Range Status  06/25/2016 153 100 - 199 mg/dL Final   Cholesterol  Date Value Ref Range Status  10/08/2023 102 <200 mg/dL Final   LDL Cholesterol (Calc)  Date Value Ref Range Status  10/08/2023 58 mg/dL (calc) Final    Comment:    Reference range: <100 . Desirable range <100 mg/dL for primary prevention;   <70 mg/dL for patients with CHD or diabetic patients  with > or = 2 CHD risk factors. SABRA LDL-C is now calculated using the Martin-Hopkins  calculation, which is a validated novel method providing  better accuracy than the Friedewald equation in the  estimation of LDL-C.  Gladis APPLETHWAITE et al. SANDREA. 7986;689(80): 2061-2068  (http://education.[facility].com/faq/FAQ164)    HDL  Date Value Ref Range Status  10/08/2023 29 (L) > OR = 40 mg/dL Final  92/94/7982 31 (L) >39 mg/dL Final   Triglycerides  Date Value Ref Range Status  10/08/2023 73 <150 mg/dL Final         Passed - Patient is not pregnant      Passed - Valid encounter within last 12 months    Recent Outpatient Visits           1 month ago Tremor   [facility] Bernardo Fend, DO   8 months ago Tremor   [facility] Bernardo Fend, DO       Future Appointments             In 2 weeks Bernardo Fend, DO [facility] [place]

## 2024-10-20 ENCOUNTER — Ambulatory Visit (INDEPENDENT_AMBULATORY_CARE_PROVIDER_SITE_OTHER): Admitting: Internal Medicine

## 2024-10-20 VITALS — BP 130/78 | HR 59 | Temp 98.1°F | Resp 16 | Ht 69.0 in | Wt 183.7 lb

## 2024-10-20 DIAGNOSIS — E782 Mixed hyperlipidemia: Secondary | ICD-10-CM

## 2024-10-20 DIAGNOSIS — I10 Essential (primary) hypertension: Secondary | ICD-10-CM | POA: Diagnosis not present

## 2024-10-20 DIAGNOSIS — R7303 Prediabetes: Secondary | ICD-10-CM | POA: Diagnosis not present

## 2024-10-20 DIAGNOSIS — Z125 Encounter for screening for malignant neoplasm of prostate: Secondary | ICD-10-CM | POA: Diagnosis not present

## 2024-10-20 DIAGNOSIS — R251 Tremor, unspecified: Secondary | ICD-10-CM | POA: Diagnosis not present

## 2024-10-20 DIAGNOSIS — E559 Vitamin D deficiency, unspecified: Secondary | ICD-10-CM

## 2024-10-20 MED ORDER — ATORVASTATIN CALCIUM 80 MG PO TABS: 80.0000 mg | ORAL_TABLET | Freq: Every day | ORAL | 1 refills | Status: AC

## 2024-10-20 MED ORDER — LOSARTAN POTASSIUM 25 MG PO TABS: 25.0000 mg | ORAL_TABLET | Freq: Every day | ORAL | 1 refills | Status: AC

## 2024-10-20 MED ORDER — PROPRANOLOL HCL 80 MG PO TABS: 80.0000 mg | ORAL_TABLET | Freq: Two times a day (BID) | ORAL | 1 refills | Status: AC

## 2024-10-20 NOTE — Progress Notes (Signed)
 Established Patient Office Visit  Subjective   Patient ID: Travis Watson, male    DOB: 07/26/1956  Age: 68 y.o. MRN: [id number]  Chief Complaint  Patient presents with   Medical Management of Chronic Issues    HPI  Patient is here today for follow up on chronic medical conditions.   Discussed the use of AI scribe software for clinical note transcription with the patient, who gave verbal consent to proceed.  History of Present Illness Travis Watson is a 68 year old male who presents for lab work and follow-up regarding tremors.  He experiences stable tremors that are annoying but not debilitating. Propranolol  taken twice daily provides no significant relief. A neurology appointment is scheduled for January 5th.  He experiences occasional lightheadedness without severe balance issues or falls.  He takes a vitamin D  supplement of 2000 IU weekly, previously prescribed 50,000 IU weekly for low levels. His vitamin D  level was 18 ng/mL last year. He takes Lipitor and losartan , with refills needed for losartan . He also takes D3 and B12 supplements.   Tremor: -In bilateral hands that has been ongoing and progressively worsening over the last year now -The tremor is mainly in his hands and occurs with action, like when he is writing or shaving.  -Noticed he is dropping things more frequently.  -Has not noticed any other neurologic symptoms or changes.  -His mother had a similar tremor that developed in her 70's. -Had now been on Propanolol 80 mg BID for a few months and has not noticed any change in symptoms  Hypertension: -Medications: Losartan  25 mg  -Patient is compliant with above medications and reports no side effects. -Checking BP at home (average): not checking, has been controlled for years -Denies any SOB, CP, vision changes, LE edema or symptoms of hypotension  HLD/history of CVA: -Medications: Lipitor 80 mg, aspirin  81 mg -Patient is compliant with  above medications and reports no side effects.  -Last lipid panel: Lipid Panel     Component Value Date/Time   CHOL 102 10/08/2023 0832   CHOL 153 06/25/2016 0847   TRIG 73 10/08/2023 0832   HDL 29 (L) 10/08/2023 0832   HDL 31 (L) 06/25/2016 0847   CHOLHDL 3.5 10/08/2023 0832   VLDL 16 08/25/2022 0504   LDLCALC 58 10/08/2023 0832   LABVLDL 23 06/25/2016 0847    Pre-Diabetes: -Last A1c 6.3% 2/25 -Trying to watch diet, cut out breads and most carbs since CVA in September 2023, eating lean proteins as well -Extensive family history of diabetes   Health Maintenance: -Blood work due -Due for Tdap, Prevnar 20 which he politely declines today -Colon cancer screening: cologuard positive; colonoscopy 9/24 with recommendations to repeat in 3-5 years.  -Lung cancer screening 4/25 Lung-RADs 2  Patient Active Problem List   Diagnosis Date Noted   Positive colorectal cancer screening using Cologuard test 08/28/2023   Adenomatous polyp of cecum 08/28/2023   Adenomatous polyp of transverse colon 08/28/2023   CVA (cerebral vascular accident) (HCC) 08/25/2022   Chronic diastolic CHF (congestive heart failure) (HCC) 08/25/2022   Acute CVA (cerebrovascular accident) (HCC) 08/24/2022   Overweight (BMI 25.0-29.9) 07/11/2019   Hx of colonic polyps 12/30/2018   Renal cyst 10/19/2018   Paraseptal emphysema (HCC) 08/13/2018   Low HDL (under 40) 06/11/2018   Prediabetes 05/29/2018   Degenerative disc disease, cervical 11/25/2016   Neural foraminal stenosis of cervical spine 11/25/2016   Tobacco abuse counseling 12/25/2015   Essential hypertension 08/23/2015  Hyperlipidemia 08/23/2015   Past Medical History:  Diagnosis Date   Hyperlipidemia    Hypertension    Paraseptal emphysema (HCC) 08/13/2018   Past Surgical History:  Procedure Laterality Date   COLONOSCOPY WITH PROPOFOL  N/A 08/28/2023   Procedure: COLONOSCOPY WITH PROPOFOL ;  Surgeon: Unk Corinn Skiff, MD;  Location: [facility] ENDOSCOPY;   Service: Gastroenterology;  Laterality: N/A;   POLYPECTOMY  08/28/2023   Procedure: POLYPECTOMY;  Surgeon: Unk Corinn Skiff, MD;  Location: [facility] ENDOSCOPY;  Service: Gastroenterology;;   Social History   Tobacco Use   Smoking status: Every Day    Current packs/day: 1.00    Average packs/day: 1 pack/day for 49.0 years (49.0 ttl pk-yrs)    Types: Cigarettes   Smokeless tobacco: Former    Types: Snuff   Tobacco comments:    quit smokeless tabacco about 40 years ago  Vaping Use   Vaping status: Never Used  Substance Use Topics   Alcohol use: Yes    Alcohol/week: 0.0 standard drinks of alcohol    Comment: occasional   Drug use: No   Social History   Socioeconomic History   Marital status: Single    Spouse name: Not on file   Number of children: Not on file   Years of education: Not on file   Highest education level: Associate degree: occupational, scientist, product/process development, or vocational program  Occupational History   Not on file  Tobacco Use   Smoking status: Every Day    Current packs/day: 1.00    Average packs/day: 1 pack/day for 49.0 years (49.0 ttl pk-yrs)    Types: Cigarettes   Smokeless tobacco: Former    Types: Snuff   Tobacco comments:    quit smokeless tabacco about 40 years ago  Vaping Use   Vaping status: Never Used  Substance and Sexual Activity   Alcohol use: Yes    Alcohol/week: 0.0 standard drinks of alcohol    Comment: occasional   Drug use: No   Sexual activity: Not Currently  Other Topics Concern   Not on file  Social History Narrative   Not on file   Social Drivers of Health   Financial Resource Strain: Low Risk  (10/17/2024)   Overall Financial Resource Strain (CARDIA)    Difficulty of Paying Living Expenses: Not hard at all  Food Insecurity: No Food Insecurity (10/17/2024)   Hunger Vital Sign    Worried About Running Out of Food in the Last Year: Never true    Ran Out of Food in the Last Year: Never true  Transportation Needs: No Transportation Needs  (10/17/2024)   PRAPARE - Administrator, Civil Service (Medical): No    Lack of Transportation (Non-Medical): No  Physical Activity: Insufficiently Active (10/17/2024)   Exercise Vital Sign    Days of Exercise per Week: 3 days    Minutes of Exercise per Session: 30 min  Stress: No Stress Concern Present (10/17/2024)   [employer] of Occupational Health - Occupational Stress Questionnaire    Feeling of Stress: Not at all  Social Connections: Socially Isolated (10/17/2024)   Social Connection and Isolation Panel    Frequency of Communication with Friends and Family: Twice a week    Frequency of Social Gatherings with Friends and Family: Once a week    Attends Religious Services: Never    Database Administrator or Organizations: No    Attends Engineer, Structural: Not on file    Marital Status: Divorced  Intimate Partner Violence: Not  At Risk (06/23/2024)   Humiliation, Afraid, Rape, and Kick questionnaire    Fear of Current or Ex-Partner: No    Emotionally Abused: No    Physically Abused: No    Sexually Abused: No   Family Status  Relation Name Status   Mother  Deceased       diabetes and emphysema   Father  Deceased       old age   PGF  Deceased       heart attack    Brother  Alive   Son  Alive   MGM  Deceased   MGF  Deceased   PGM  Deceased  No partnership data on file   Family History  Problem Relation Age of Onset   Diabetes Mother    Emphysema Mother    Hyperlipidemia Father    Cancer Father        prostate   Heart disease Maternal Grandmother    Hypertension Maternal Grandmother    Heart disease Maternal Grandfather    Hypertension Maternal Grandfather    Migraines Paternal Grandmother    No Known Allergies    Review of Systems  Constitutional:  Negative for chills and fever.  Neurological:  Positive for tremors.      Objective:     BP 130/78 (Cuff Size: Large)   Pulse (!) 59   Temp 98.1 F (36.7 C) (Oral)   Resp 16    Ht 5' 9 (1.753 m)   Wt 183 lb 11.2 oz (83.3 kg)   SpO2 99%   BMI 27.13 kg/m  BP Readings from Last 3 Encounters:  10/20/24 130/78  08/09/24 126/74  02/05/24 134/72   Wt Readings from Last 3 Encounters:  10/20/24 183 lb 11.2 oz (83.3 kg)  08/09/24 183 lb 8 oz (83.2 kg)  02/05/24 182 lb 4.8 oz (82.7 kg)      Physical Exam Constitutional:      Appearance: Normal appearance.  HENT:     Head: Normocephalic and atraumatic.  Eyes:     Conjunctiva/sclera: Conjunctivae normal.  Cardiovascular:     Rate and Rhythm: Normal rate and regular rhythm.  Pulmonary:     Effort: Pulmonary effort is normal.     Breath sounds: Normal breath sounds.  Skin:    General: Skin is warm and dry.  Neurological:     General: No focal deficit present.     Mental Status: He is alert. Mental status is at baseline.     Comments: Tremor in left hand only with fine motor movements   Psychiatric:        Mood and Affect: Mood normal.        Behavior: Behavior normal.      No results found for any visits on 10/20/24.   Last CBC Lab Results  Component Value Date   WBC 9.7 10/08/2023   HGB 15.3 10/08/2023   HCT 44.7 10/08/2023   MCV 95.9 10/08/2023   MCH 32.8 10/08/2023   RDW 11.8 10/08/2023   PLT 271 10/08/2023   Last metabolic panel Lab Results  Component Value Date   GLUCOSE 104 (H) 10/08/2023   NA 139 10/08/2023   K 4.9 10/08/2023   CL 104 10/08/2023   CO2 27 10/08/2023   BUN 11 10/08/2023   CREATININE 0.87 10/08/2023   EGFR 95 10/08/2023   CALCIUM  9.4 10/08/2023   PROT 7.0 10/08/2023   ALBUMIN 4.2 01/22/2017   LABGLOB 2.9 06/25/2016   AGRATIO 1.5 06/25/2016   BILITOT  0.6 10/08/2023   ALKPHOS 53 01/22/2017   AST 18 10/08/2023   ALT 25 10/08/2023   ANIONGAP 8 08/24/2022   Last lipids Lab Results  Component Value Date   CHOL 102 10/08/2023   HDL 29 (L) 10/08/2023   LDLCALC 58 10/08/2023   TRIG 73 10/08/2023   CHOLHDL 3.5 10/08/2023   Last hemoglobin A1c Lab Results   Component Value Date   HGBA1C 6.3 (A) 02/05/2024   Last thyroid  functions Lab Results  Component Value Date   TSH 2.83 10/08/2023   Last vitamin D  Lab Results  Component Value Date   VD25OH 18 (L) 10/08/2023   Last vitamin B12 and Folate Lab Results  Component Value Date   VITAMINB12 334 10/08/2023   FOLATE 14.5 10/08/2023      The ASCVD Risk score (Arnett DK, et al., 2019) failed to calculate for the following reasons:   Risk score cannot be calculated because patient has a medical history suggesting prior/existing ASCVD    Assessment & Plan:   Assessment & Plan Tremor Tremors stable, propranolol  ineffective, heart rate slightly low at 59 bpm due to propranolol . - Continue propranolol  until neurology appointment. - Monitor for increased symptoms of lightheadedness or fatigue; consider dose adjustment if symptoms worsen. - Attend neurology appointment on January 5th.  Essential hypertension Blood pressure well-controlled with losartan . - Refill losartan  prescription.  Mixed hyperlipidemia Cholesterol levels to be assessed with upcoming labs, managed with atorvastatin . - Refill atorvastatin  prescription. - Order cholesterol lab test.  Pre-Diabetes - Recheck A1c.   Vitamin D  deficiency Vitamin D  low at 18 ng/mL last year, current supplementation may be insufficient. - Order vitamin D  lab test. - Consider prescribing 50,000 IU weekly for 12 weeks if levels remain low. - Adjust vitamin D  supplementation based on lab results.  - Lipid Profile - atorvastatin  (LIPITOR) 80 MG tablet; Take 1 tablet (80 mg total) by mouth daily.  Dispense: 90 tablet; Refill: 1 - CBC w/Diff/Platelet - Comprehensive Metabolic Panel (CMET) - losartan  (COZAAR ) 25 MG tablet; Take 1 tablet (25 mg total) by mouth daily.  Dispense: 90 tablet; Refill: 1 - propranolol  (INDERAL ) 80 MG tablet; Take 1 tablet (80 mg total) by mouth 2 (two) times daily.  Dispense: 180 tablet; Refill: 1 - PSA -  Vitamin D  (25 hydroxy) - HgB A1c   Return in about 6 months (around 04/20/2025).    Sharyle Fischer, DO

## 2024-10-21 ENCOUNTER — Ambulatory Visit: Payer: Self-pay | Admitting: Internal Medicine

## 2024-10-21 LAB — COMPREHENSIVE METABOLIC PANEL WITH GFR
AG Ratio: 1.5 (calc) (ref 1.0–2.5)
ALT: 25 U/L (ref 9–46)
AST: 19 U/L (ref 10–35)
Albumin: 4.1 g/dL (ref 3.6–5.1)
Alkaline phosphatase (APISO): 66 U/L (ref 35–144)
BUN: 18 mg/dL (ref 7–25)
CO2: 28 mmol/L (ref 20–32)
Calcium: 9 mg/dL (ref 8.6–10.3)
Chloride: 106 mmol/L (ref 98–110)
Creat: 0.95 mg/dL (ref 0.70–1.35)
Globulin: 2.7 g/dL (ref 1.9–3.7)
Glucose, Bld: 96 mg/dL (ref 65–99)
Potassium: 4.7 mmol/L (ref 3.5–5.3)
Sodium: 140 mmol/L (ref 135–146)
Total Bilirubin: 0.7 mg/dL (ref 0.2–1.2)
Total Protein: 6.8 g/dL (ref 6.1–8.1)
eGFR: 88 mL/min/{1.73_m2}

## 2024-10-21 LAB — LIPID PANEL
Cholesterol: 112 mg/dL
HDL: 28 mg/dL — ABNORMAL LOW
LDL Cholesterol (Calc): 69 mg/dL
Non-HDL Cholesterol (Calc): 84 mg/dL
Total CHOL/HDL Ratio: 4 (calc)
Triglycerides: 72 mg/dL

## 2024-10-21 LAB — CBC WITH DIFFERENTIAL/PLATELET
Absolute Lymphocytes: 2554 {cells}/uL (ref 850–3900)
Absolute Monocytes: 1120 {cells}/uL — ABNORMAL HIGH (ref 200–950)
Basophils Absolute: 45 {cells}/uL (ref 0–200)
Basophils Relative: 0.4 %
Eosinophils Absolute: 213 {cells}/uL (ref 15–500)
Eosinophils Relative: 1.9 %
HCT: 44.4 % (ref 38.5–50.0)
Hemoglobin: 14.8 g/dL (ref 13.2–17.1)
MCH: 33 pg (ref 27.0–33.0)
MCHC: 33.3 g/dL (ref 32.0–36.0)
MCV: 99.1 fL (ref 80.0–100.0)
MPV: 11.1 fL (ref 7.5–12.5)
Monocytes Relative: 10 %
Neutro Abs: 7269 {cells}/uL (ref 1500–7800)
Neutrophils Relative %: 64.9 %
Platelets: 247 Thousand/uL (ref 140–400)
RBC: 4.48 Million/uL (ref 4.20–5.80)
RDW: 12 % (ref 11.0–15.0)
Total Lymphocyte: 22.8 %
WBC: 11.2 Thousand/uL — ABNORMAL HIGH (ref 3.8–10.8)

## 2024-10-21 LAB — HEMOGLOBIN A1C
Hgb A1c MFr Bld: 6.1 % — ABNORMAL HIGH (ref <5.7)
Mean Plasma Glucose: 128 mg/dL
eAG (mmol/L): 7.1 mmol/L

## 2024-10-21 LAB — VITAMIN D 25 HYDROXY (VIT D DEFICIENCY, FRACTURES): Vit D, 25-Hydroxy: 38 ng/mL (ref 30–100)

## 2024-10-21 LAB — PSA: PSA: 0.61 ng/mL (ref <=4.00)

## 2024-12-26 ENCOUNTER — Ambulatory Visit: Admitting: Neurology

## 2024-12-26 ENCOUNTER — Encounter: Payer: Self-pay | Admitting: Neurology

## 2024-12-26 VITALS — BP 154/71 | HR 51 | Ht 69.0 in | Wt 189.4 lb

## 2024-12-26 DIAGNOSIS — R251 Tremor, unspecified: Secondary | ICD-10-CM | POA: Diagnosis not present

## 2024-12-26 DIAGNOSIS — Z8673 Personal history of transient ischemic attack (TIA), and cerebral infarction without residual deficits: Secondary | ICD-10-CM | POA: Diagnosis not present

## 2024-12-26 NOTE — Patient Instructions (Addendum)
 You have a rather mild tremor of your left hand with handwriting.  It is possible that you have a mild case of familial tremor or essential tremor due to your mom's history of tremor.  Since you have not benefited from the beta-blocker, you can talk to your primary care about gradually coming off of it but do monitor your blood pressure as it may creep up as you come off of the beta-blocker which is also used for blood pressure management.  I do not see any signs or symptoms of parkinson's like disease or what we call parkinsonism.  For your tremor, I would not recommend any new medications at this time.  Please remember, that any kind of tremor may be exacerbated by anxiety, anger, nervousness, excitement, dehydration, sleep deprivation, thyroid  dysfunction, by caffeine, and low blood sugar values or blood sugar fluctuations. Some medications can exacerbate tremors, this includes certain asthma or COPD medications and certain antidepressants.  Please increase your water intake to about 64 ounces per day and limit your caffeine to 1-2 servings per day on average. I would be happy to see you back as needed.  With your next checkup with your primary care and blood work, I recommend that you get your thyroid  function rechecked as thyroid  dysfunction, particularly over function of your thyroid  gland can cause or exacerbate tremors.

## 2024-12-26 NOTE — Progress Notes (Signed)
 Subjective:    Patient ID: Travis Watson is a 69 y.o. male.  HPI    True Mar, MD, PhD [facility] [address] [address] [place], [place] [address]  Dear Dr. Bernardo,  I saw your patient, Travis Watson, upon your kind request in my neurologic clinic today for evaluation of his hand tremor.  The patient is unaccompanied today.  As you know, Travis Watson is a 69 year old male with an underlying medical history of CVA (right thalamic stroke in 2023), chronic diastolic CHF, prediabetes, degenerative cervical disc disease, prior smoking, hypertension, hyperlipidemia, and mildly overweight state, who reports an approximately 1 year history of intermittent left hand tremor affecting only his handwriting typically.  He does not have a tremor at rest or with holding something or when pursuing fine motor skills such as buttoning.  He has noticed some worsening with handwriting and feels that he can hardly sign his name sometimes.  His mom had a hand tremor in her 51s.  He has not had any balance issues, does not believe the propranolol  has helped. I reviewed your office note from 08/09/2024.  He has been on propranolol  80 mg strength twice daily without any significant improvement in his hand tremor.  He started off with 80 mg once daily.  He does have some bradycardia from it.  His systolic blood pressure is a little elevated today but he has no symptoms from it.  He has had some coffee prior to coming in.  He does drink caffeine in the form of coffee, about 3 cups/day and does not drink a whole lot of water, maybe up to 1 quart per day.  He drinks alcohol very rarely.  He is divorced for many years, lives alone.  He has 1 grown son.  He works as an art gallery manager.  He has had some residual numbness and tingling in the left hand first 3 digits since the stroke but no other residual symptoms as such.  The numbness and tingling comes and goes.   TSH was normal in  October 2024, I have not seen a more recent value. He had a brain MRI without contrast through [facility] on 08/24/2022 with indication of neurodeficit, acute stroke suspected.  Numbness involving the face, left hand and left foot.  I reviewed the results:  IMPRESSION: 1. Acute right thalamic infarct. 2. Moderate chronic small vessel ischemic disease.   He reports sleeping fairly well, he is not aware of any snoring and does not wake up gasping for air.  His Past Medical History Is Significant For: Past Medical History:  Diagnosis Date   Hyperlipidemia    Hypertension    Paraseptal emphysema (HCC) 08/13/2018    His Past Surgical History Is Significant For: Past Surgical History:  Procedure Laterality Date   COLONOSCOPY WITH PROPOFOL  N/A 08/28/2023   Procedure: COLONOSCOPY WITH PROPOFOL ;  Surgeon: Unk Corinn Skiff, MD;  Location: [facility] ENDOSCOPY;  Service: Gastroenterology;  Laterality: N/A;   POLYPECTOMY  08/28/2023   Procedure: POLYPECTOMY;  Surgeon: Unk Corinn Skiff, MD;  Location: [facility] ENDOSCOPY;  Service: Gastroenterology;;    His Family History Is Significant For: Family History  Problem Relation Age of Onset   Diabetes Mother    Emphysema Mother    Hyperlipidemia Father    Cancer Father        prostate   Heart disease Maternal Grandmother    Hypertension Maternal Grandmother    Heart disease Maternal Grandfather  Hypertension Maternal Grandfather    Migraines Paternal Grandmother    Seizures Neg Hx    Stroke Neg Hx    Sleep apnea Neg Hx     His Social History Is Significant For: Social History   Socioeconomic History   Marital status: Single    Spouse name: Not on file   Number of children: Not on file   Years of education: Not on file   Highest education level: Associate degree: occupational, scientist, product/process development, or vocational program  Occupational History   Not on file  Tobacco Use   Smoking status: Every Day    Current packs/day: 1.00     Average packs/day: 1 pack/day for 49.0 years (49.0 ttl pk-yrs)    Types: Cigarettes   Smokeless tobacco: Former    Types: Snuff   Tobacco comments:    quit smokeless tabacco about 40 years ago  Vaping Use   Vaping status: Never Used  Substance and Sexual Activity   Alcohol use: Yes    Alcohol/week: 0.0 standard drinks of alcohol    Comment: occasional   Drug use: No   Sexual activity: Not Currently  Other Topics Concern   Not on file  Social History Narrative   2-3 cups of coffee daily    Social Drivers of Health   Tobacco Use: High Risk (12/26/2024)   Patient History    Smoking Tobacco Use: Every Day    Smokeless Tobacco Use: Former    Passive Exposure: Not on Actuary Strain: Low Risk (10/17/2024)   Overall Financial Resource Strain (CARDIA)    Difficulty of Paying Living Expenses: Not hard at all  Food Insecurity: No Food Insecurity (10/17/2024)   Epic    Worried About Radiation Protection Practitioner of Food in the Last Year: Never true    Ran Out of Food in the Last Year: Never true  Transportation Needs: No Transportation Needs (10/17/2024)   Epic    Lack of Transportation (Medical): No    Lack of Transportation (Non-Medical): No  Physical Activity: Insufficiently Active (10/17/2024)   Exercise Vital Sign    Days of Exercise per Week: 3 days    Minutes of Exercise per Session: 30 min  Stress: No Stress Concern Present (10/17/2024)   [employer] of Occupational Health - Occupational Stress Questionnaire    Feeling of Stress: Not at all  Social Connections: Socially Isolated (10/17/2024)   Social Connection and Isolation Panel    Frequency of Communication with Friends and Family: Twice a week    Frequency of Social Gatherings with Friends and Family: Once a week    Attends Religious Services: Never    Database Administrator or Organizations: No    Attends Engineer, Structural: Not on file    Marital Status: Divorced  Depression (PHQ2-9): Low Risk  (06/23/2024)   Depression (PHQ2-9)    PHQ-2 Score: 0  Alcohol Screen: Low Risk (10/17/2024)   Alcohol Screen    Last Alcohol Screening Score (AUDIT): 1  Housing: Unknown (10/17/2024)   Epic    Unable to Pay for Housing in the Last Year: No    Number of Times Moved in the Last Year: Not on file    Homeless in the Last Year: No  Utilities: Not At Risk (06/23/2024)   Epic    Threatened with loss of utilities: No  Health Literacy: Adequate Health Literacy (06/23/2024)   B1300 Health Literacy    Frequency of need for help with medical instructions: Never  His Allergies Are:  Allergies[1]:   His Current Medications Are:  Outpatient Encounter Medications as of 12/26/2024  Medication Sig   aspirin  EC 81 MG tablet Take 1 tablet (81 mg total) by mouth daily. Swallow whole.   atorvastatin  (LIPITOR) 80 MG tablet Take 1 tablet (80 mg total) by mouth daily.   cholecalciferol (VITAMIN D3) 25 MCG (1000 UNIT) tablet Take 2,000 Units by mouth daily.   fluocinonide cream (LIDEX) 0.05 % APPLY ON THE SKIN TWICE A DAY AS NEEDED   losartan  (COZAAR ) 25 MG tablet Take 1 tablet (25 mg total) by mouth daily.   propranolol  (INDERAL ) 80 MG tablet Take 1 tablet (80 mg total) by mouth 2 (two) times daily.   No facility-administered encounter medications on file as of 12/26/2024.  :   Review of Systems:  Out of a complete 14 point review of systems, all are reviewed and negative with the exception of these symptoms as listed below:  Review of Systems  Objective:  Neurological Exam  Physical Exam Physical Examination:   Vitals:   12/26/24 1443  BP: (!) 154/71  Pulse: (!) 51    General Examination: The patient is a very pleasant 69 y.o. male in no acute distress. He appears well-developed and well-nourished and well groomed.   HEENT: Normocephalic, atraumatic, pupils are equal, round and reactive to light, extraocular tracking is good without limitation to gaze excursion or nystagmus noted. No  photophobia.  Corrective eye glasses in place. Hearing is grossly intact to tuning fork.  Face is symmetric with normal facial animation and normal facial sensation to light touch, temperature and vibration sense. Speech is clear without dysarthria. There is no hypophonia. There is no lip, neck/head, jaw or voice tremor. Neck is supple with full range of passive and active motion. There are no carotid bruits on auscultation.  Airway/Oropharynx exam reveals: mild mouth dryness, adequate dental hygiene and no significant airway crowding, small airway entry noted.  Tongue protrudes centrally and palate elevates symmetrically.   Chest: Clear to auscultation without wheezing, rhonchi or crackles noted.  Heart: S1+S2+0, regular and normal without murmurs, rubs or gallops noted.   Abdomen: Soft, non-tender and non-distended.  Extremities: There is no pitting edema in the distal lower extremities bilaterally.   Skin: Warm and dry without trophic changes noted.   Musculoskeletal: exam reveals no obvious joint deformities.   Neurologically:  Mental status: The patient is awake, alert and oriented in all 4 spheres. His immediate and remote memory, attention, language skills and fund of knowledge are appropriate. There is no evidence of aphasia, agnosia, apraxia or anomia. Speech is clear with normal prosody and enunciation. Thought process is linear. Mood is normal and affect is normal.  Cranial nerves II - XII are as described above under HEENT exam.  Motor exam: Normal bulk, strength and tone is noted. There is no obvious action or resting tremor.  No drift or rebound, no postural or intention tremor. On 12/26/2024: On Archimedes spiral drawing he has a mild tremor with the left hand which is his dominant hand.  Right sided spiral is slightly insecure but not tremulous.  Handwriting is legible, not micrographic, mildly tremulous. Fine motor skills and coordination: Intact finger taps, hand movements and  rapid alternating patting with both upper extremities, normal foot taps bilaterally with the lower extremities.  Cerebellar testing: No dysmetria or intention tremor. There is no truncal or gait ataxia.  Normal heel-to-shin. Sensory exam: intact to light touch, temperature and vibration sense in  the upper and lower extremities.  Reflexes are trace throughout.  Toes are downgoing bilaterally. Romberg negative. Gait, station and balance: He stands easily. No veering to one side is noted. No leaning to one side is noted. Posture is age-appropriate and stance is narrow based. Gait shows normal stride length and normal pace. No problems turning are noted.  Normal tandem walk.  Assessment and Plan:  In summary, Travis Watson is a very pleasant 69 y.o.-year old male with an underlying medical history of CVA (right thalamic stroke in 2023), chronic diastolic CHF, prediabetes, degenerative cervical disc disease, prior smoking, hypertension, hyperlipidemia, and mildly overweight state, who presents for evaluation of his intermittent left hand tremor of approximately 1 years duration with a mild tremor noted with handwriting only.  He does have a family history of tremor affecting his mom.  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  He has not responded to a trial of a beta-blocker and I would not recommend any new medications at this time as his findings otherwise are quite benign.  He I do not see any signs of parkinsonism and he is largely reassured.  With his next checkup with your office I recommend checking his thyroid  function routinely.  We talked about tremor triggers and alleviating factors and lifestyle modification.  He is advised to increase his water intake and limit his caffeine.    Below is a summary of my recommendations and our discussion points from today's visit, based on chart review, history and examination. They were given these instructions verbally during the visit in detail  and also in writing in the MyChart after visit summary (AVS), which they can access electronically. << You have a rather mild tremor of your left hand with handwriting.  It is possible that you have a mild case of familial tremor or essential tremor due to your mom's history of tremor.  Since you have not benefited from the beta-blocker, you can talk to your primary care about gradually coming off of it but do monitor your blood pressure as it may creep up as you come off of the beta-blocker which is also used for blood pressure management.  I do not see any signs or symptoms of parkinson's like disease or what we call parkinsonism.  For your tremor, I would not recommend any new medications at this time.  Please remember, that any kind of tremor may be exacerbated by anxiety, anger, nervousness, excitement, dehydration, sleep deprivation, thyroid  dysfunction, by caffeine, and low blood sugar values or blood sugar fluctuations. Some medications can exacerbate tremors, this includes certain asthma or COPD medications and certain antidepressants.  Please increase your water intake to about 64 ounces per day and limit your caffeine to 1-2 servings per day on average. I would be happy to see you back as needed.  With your next checkup with your primary care and blood work, I recommend that you get your thyroid  function rechecked as thyroid  dysfunction, particularly over function of your thyroid  gland can cause or exacerbate tremors. >>   I answered all his questions today and he was in agreement with our plan.  Thank you very much for allowing me to participate in the care of this nice patient. If I can be of any further assistance to you please do not hesitate to call me at [phone].  Sincerely,   True Mar, MD, PhD     [1] No Known Allergies

## 2025-04-21 ENCOUNTER — Ambulatory Visit: Admitting: Internal Medicine

## 2025-07-06 ENCOUNTER — Ambulatory Visit
